# Patient Record
Sex: Male | Born: 1960
Health system: Southern US, Community
[De-identification: ages and names within clinical notes are randomized; demographics above are authoritative.]

## PROBLEM LIST (undated history)

## (undated) DIAGNOSIS — K8020 Calculus of gallbladder without cholecystitis without obstruction: Secondary | ICD-10-CM

## (undated) DIAGNOSIS — E7800 Pure hypercholesterolemia, unspecified: Secondary | ICD-10-CM

## (undated) DIAGNOSIS — H269 Unspecified cataract: Secondary | ICD-10-CM

## (undated) DIAGNOSIS — I10 Essential (primary) hypertension: Secondary | ICD-10-CM

## (undated) DIAGNOSIS — M1990 Unspecified osteoarthritis, unspecified site: Secondary | ICD-10-CM

## (undated) DIAGNOSIS — R42 Dizziness and giddiness: Secondary | ICD-10-CM

## (undated) DIAGNOSIS — R002 Palpitations: Secondary | ICD-10-CM

## (undated) HISTORY — DX: Unspecified cataract: H26.9

## (undated) HISTORY — DX: Pure hypercholesterolemia, unspecified: E78.00

## (undated) HISTORY — DX: Palpitations: R00.2

## (undated) HISTORY — DX: Dizziness and giddiness: R42

## (undated) HISTORY — DX: Calculus of gallbladder without cholecystitis without obstruction: K80.20

## (undated) HISTORY — DX: Unspecified osteoarthritis, unspecified site: M19.90

## (undated) HISTORY — DX: Essential (primary) hypertension: I10

---

## 1997-07-28 HISTORY — PX: KNEE SURGERY: SHX244

## 2003-11-21 ENCOUNTER — Encounter: Payer: Self-pay | Admitting: Internal Medicine

## 2003-11-22 ENCOUNTER — Encounter: Payer: Self-pay | Admitting: Internal Medicine

## 2007-06-11 ENCOUNTER — Ambulatory Visit: Payer: Self-pay | Admitting: Family Medicine

## 2007-06-11 ENCOUNTER — Encounter (INDEPENDENT_AMBULATORY_CARE_PROVIDER_SITE_OTHER): Payer: Self-pay | Admitting: Internal Medicine

## 2007-06-11 DIAGNOSIS — R079 Chest pain, unspecified: Secondary | ICD-10-CM | POA: Insufficient documentation

## 2007-06-11 DIAGNOSIS — E669 Obesity, unspecified: Secondary | ICD-10-CM | POA: Insufficient documentation

## 2007-06-11 DIAGNOSIS — K219 Gastro-esophageal reflux disease without esophagitis: Secondary | ICD-10-CM | POA: Insufficient documentation

## 2007-06-11 DIAGNOSIS — L80 Vitiligo: Secondary | ICD-10-CM | POA: Insufficient documentation

## 2007-06-14 ENCOUNTER — Ambulatory Visit: Payer: Self-pay | Admitting: Internal Medicine

## 2007-06-18 ENCOUNTER — Ambulatory Visit: Payer: Self-pay | Admitting: Family Medicine

## 2007-06-18 DIAGNOSIS — R7309 Other abnormal glucose: Secondary | ICD-10-CM | POA: Insufficient documentation

## 2007-06-18 DIAGNOSIS — E781 Pure hyperglyceridemia: Secondary | ICD-10-CM | POA: Insufficient documentation

## 2007-06-18 DIAGNOSIS — E782 Mixed hyperlipidemia: Secondary | ICD-10-CM | POA: Insufficient documentation

## 2007-06-23 ENCOUNTER — Ambulatory Visit: Payer: Self-pay | Admitting: Cardiology

## 2007-06-30 ENCOUNTER — Encounter (INDEPENDENT_AMBULATORY_CARE_PROVIDER_SITE_OTHER): Payer: Self-pay | Admitting: Internal Medicine

## 2007-06-30 ENCOUNTER — Encounter: Payer: Self-pay | Admitting: Cardiology

## 2007-06-30 ENCOUNTER — Ambulatory Visit: Payer: Self-pay

## 2007-09-02 ENCOUNTER — Ambulatory Visit: Payer: Self-pay | Admitting: Family Medicine

## 2007-09-08 LAB — CONVERTED CEMR LAB
Cholesterol: 161 mg/dL (ref 0–200)
HDL: 25 mg/dL — ABNORMAL LOW (ref >39.0)
Total CHOL/HDL Ratio: 6.4
VLDL: 53 mg/dL — ABNORMAL HIGH (ref 0–40)

## 2007-09-09 ENCOUNTER — Ambulatory Visit: Payer: Self-pay | Admitting: Family Medicine

## 2008-07-04 ENCOUNTER — Ambulatory Visit: Payer: Self-pay | Admitting: Family Medicine

## 2008-07-04 DIAGNOSIS — R42 Dizziness and giddiness: Secondary | ICD-10-CM | POA: Insufficient documentation

## 2008-07-05 ENCOUNTER — Ambulatory Visit: Payer: Self-pay | Admitting: Family Medicine

## 2008-07-07 LAB — CONVERTED CEMR LAB
CO2: 30 meq/L (ref 19–32)
Calcium: 9.3 mg/dL (ref 8.4–10.5)
Chloride: 109 meq/L (ref 96–112)
Direct LDL: 70.7 mg/dL
GFR calc non Af Amer: 85 mL/min
PSA: 0.79 ng/mL (ref 0.10–4.00)
Sodium: 141 meq/L (ref 135–145)
Total CHOL/HDL Ratio: 7.5
Triglycerides: 325 mg/dL (ref 0–149)
VLDL: 65 mg/dL — ABNORMAL HIGH (ref 0–40)

## 2008-07-11 ENCOUNTER — Encounter (INDEPENDENT_AMBULATORY_CARE_PROVIDER_SITE_OTHER): Payer: Self-pay | Admitting: Internal Medicine

## 2008-07-11 LAB — CONVERTED CEMR LAB
ALT: 38 units/L (ref 0–53)
Alkaline Phosphatase: 66 units/L (ref 39–117)
BUN: 14 mg/dL (ref 6–23)
Basophils Absolute: 0 10*3/uL (ref 0.0–0.1)
Basophils Relative: 0.4 % (ref 0.0–1.0)
Calcium: 9.6 mg/dL (ref 8.4–10.5)
Eosinophils Absolute: 0.1 10*3/uL (ref 0.0–0.6)
GFR calc Af Amer: 103 mL/min
GFR calc non Af Amer: 85 mL/min
HDL: 24.2 mg/dL — ABNORMAL LOW (ref >39.0)
LDL Cholesterol: 83 mg/dL (ref 0–99)
Lymphocytes Relative: 32.5 % (ref 12.0–46.0)
MCHC: 35.6 g/dL (ref 30.0–36.0)
MCV: 89.1 fL (ref 78.0–100.0)
Monocytes Relative: 6.7 % (ref 3.0–11.0)
Neutro Abs: 3.6 10*3/uL (ref 1.4–7.7)
Platelets: 192 10*3/uL (ref 150–400)
Potassium: 4.4 meq/L (ref 3.5–5.1)
TSH: 2.19 microintl units/mL (ref 0.35–5.50)
Total Bilirubin: 0.8 mg/dL (ref 0.3–1.2)
Total Protein: 6.9 g/dL (ref 6.0–8.3)
Triglycerides: 289 mg/dL (ref 0–149)
VLDL: 58 mg/dL — ABNORMAL HIGH (ref 0–40)

## 2008-08-11 ENCOUNTER — Ambulatory Visit: Payer: Self-pay | Admitting: Internal Medicine

## 2008-08-21 ENCOUNTER — Encounter (INDEPENDENT_AMBULATORY_CARE_PROVIDER_SITE_OTHER): Payer: Self-pay | Admitting: *Deleted

## 2008-08-22 LAB — CONVERTED CEMR LAB
ALT: 56 units/L — ABNORMAL HIGH (ref 0–53)
AST: 40 units/L — ABNORMAL HIGH (ref 0–37)
Alkaline Phosphatase: 48 units/L (ref 39–117)
HDL: 26.4 mg/dL — ABNORMAL LOW (ref >39.0)
Total Bilirubin: 1 mg/dL (ref 0.3–1.2)
Total CHOL/HDL Ratio: 7.2

## 2008-08-30 ENCOUNTER — Telehealth (INDEPENDENT_AMBULATORY_CARE_PROVIDER_SITE_OTHER): Payer: Self-pay | Admitting: Internal Medicine

## 2009-02-21 ENCOUNTER — Ambulatory Visit: Payer: Self-pay | Admitting: Family Medicine

## 2009-02-22 LAB — CONVERTED CEMR LAB
ALT: 43 units/L (ref 0–53)
AST: 38 units/L — ABNORMAL HIGH (ref 0–37)
Cholesterol: 154 mg/dL (ref 0–200)
Direct LDL: 76.1 mg/dL
Total CHOL/HDL Ratio: 6
VLDL: 62.2 mg/dL — ABNORMAL HIGH (ref 0.0–40.0)

## 2009-05-22 ENCOUNTER — Ambulatory Visit: Payer: Self-pay | Admitting: Family Medicine

## 2009-05-22 DIAGNOSIS — N410 Acute prostatitis: Secondary | ICD-10-CM | POA: Insufficient documentation

## 2009-05-22 LAB — CONVERTED CEMR LAB
Bacteria, UA: 0
Glucose, Urine, Semiquant: NEGATIVE
Nitrite: NEGATIVE
Specific Gravity, Urine: 1.015
WBC Urine, dipstick: NEGATIVE
pH: 6.5

## 2009-05-24 LAB — CONVERTED CEMR LAB
ALT: 40 units/L (ref 0–53)
AST: 26 units/L (ref 0–37)
BUN: 14 mg/dL (ref 6–23)
Calcium: 9 mg/dL (ref 8.4–10.5)
Chloride: 106 meq/L (ref 96–112)
Creatinine, Ser: 1.1 mg/dL (ref 0.4–1.5)
Direct LDL: 151.4 mg/dL
HDL: 31.2 mg/dL — ABNORMAL LOW (ref >39.00)
Triglycerides: 102 mg/dL (ref 0.0–149.0)
VLDL: 20.4 mg/dL (ref 0.0–40.0)

## 2009-08-09 ENCOUNTER — Ambulatory Visit: Payer: Self-pay | Admitting: Family Medicine

## 2009-08-09 ENCOUNTER — Encounter: Payer: Self-pay | Admitting: Family Medicine

## 2009-08-09 DIAGNOSIS — R310 Gross hematuria: Secondary | ICD-10-CM | POA: Insufficient documentation

## 2009-08-09 LAB — CONVERTED CEMR LAB
Glucose, Urine, Semiquant: NEGATIVE
Nitrite: NEGATIVE
Specific Gravity, Urine: 1.02
WBC Urine, dipstick: NEGATIVE

## 2009-08-22 ENCOUNTER — Telehealth: Payer: Self-pay | Admitting: Family Medicine

## 2009-08-23 ENCOUNTER — Encounter: Payer: Self-pay | Admitting: Family Medicine

## 2009-08-28 ENCOUNTER — Encounter (INDEPENDENT_AMBULATORY_CARE_PROVIDER_SITE_OTHER): Payer: Self-pay | Admitting: *Deleted

## 2009-09-13 ENCOUNTER — Ambulatory Visit: Payer: Self-pay | Admitting: Family Medicine

## 2009-09-13 LAB — CONVERTED CEMR LAB
Direct LDL: 81.4 mg/dL
Glucose, Bld: 101 mg/dL — ABNORMAL HIGH (ref 70–99)
HDL: 33.4 mg/dL — ABNORMAL LOW (ref >39.00)
VLDL: 52 mg/dL — ABNORMAL HIGH (ref 0.0–40.0)

## 2010-03-25 ENCOUNTER — Encounter: Payer: Self-pay | Admitting: Family Medicine

## 2010-08-27 NOTE — Letter (Signed)
Summary: [facility]  [facility]   Imported By: Lanelle Bal 09/03/2009 12:03:41  _____________________________________________________________________  External Attachment:    Type:   Image     Comment:   External Document

## 2010-08-27 NOTE — Letter (Signed)
Summary: [facility]  [facility]   Imported By: Lanelle Bal 04/03/2010 14:19:04  _____________________________________________________________________  External Attachment:    Type:   Image     Comment:   External Document  Appended Document: [facility] hematuria resolved, likely due to acute prostatitis.

## 2010-08-27 NOTE — Progress Notes (Signed)
Summary: Valium rx  Phone Note Call from Patient Call back at [phone]   Caller: Eulis Manly Call For: Dr. Dayton Martes Summary of Call: Pt saw Dr. Dayton Martes on 08/09/09 and got rx for Trilipix and pt and Mr. Maisie Fus thought Dr. Dayton Martes was going to give rx for Valium 5mg  # 2 pills on same rx but when [facility] Tr[facility]ipix was there. Pt saw urologist and has to go back to have a test Barbara Cower called where a camera would ck pt's prostate and pt needs the valium for the test. Pt would like me called to [facility] [phone] if possible. Please advise.  Initial call taken by: Lewanda Rife LPN,  August 22, 2009 3:39 PM  Follow-up for Phone Call        Medication phoned to pharmacy.  Follow-up by: Delilah Shan CMA Duncan Dull),  August 22, 2009 4:19 PM    New/Updated Medications: ALPRAZOLAM 0.5 MG  TABS (ALPRAZOLAM) 1 tab by mouth at bedtime evening before procedure, 1 tab by mouth 1 hour before procedure. Prescriptions: ALPRAZOLAM 0.5 MG  TABS (ALPRAZOLAM) 1 tab by mouth at bedtime evening before procedure, 1 tab by mouth 1 hour before procedure.  #2 x 0   Entered and Authorized by:   Ruthe Mannan MD   Signed by:   Delilah Shan CMA (AAMA) on 08/22/2009   Method used:   Historical   RxID:   [id number]

## 2010-08-27 NOTE — Assessment & Plan Note (Signed)
Summary: BLOOD IN URINE/CLE   Vital Signs:  Patient profile:   50 year old Farrell Height:      64 inches Weight:      190.25 pounds BMI:     32.77 Temp:     98 degrees F oral Pulse rate:   88 / minute Pulse rhythm:   regular BP sitting:   102 / 62  (left arm) Cuff size:   large  Vitals Entered By: Delilah Shan CMA Duncan Dull) (August 09, 2009 9:01 AM) CC: Blood in urine   History of Present Illness: Here for gross hematuria for past couple of days, feels no discomfort when he urinates. Has been ongoing for several months. Seen in october and treated for prostatitis. These symptoms are different, although still has-dribbling following completion of urination discomfort in lower abd.  Low back pain resolved.  Microscopic hematuria in October.  Of note, had URI three weeks ago.  No fevers, chills, nausea, or vomitng. He is non smoker. Works as a Production designer, theatre/television/film for [employer], not physically strenuous.   Current Medications (verified): 1)  Trilipix 135 Mg Cpdr (Choline Fenofibrate) .Marland Kitchen.. 1 Once Daily For Triglycerides By Mouth  Allergies (verified): No Known Drug Allergies  Review of Systems      See HPI General:  Denies chills, fatigue, fever, and malaise. GI:  Complains of abdominal pain; denies bloody stools, change in bowel habits, nausea, and vomiting. GU:  Complains of hematuria; denies discharge and incontinence.  Physical Exam  General:  alert, well-developed, well-nourished, and well-hydrated.   Abdomen:  no  CVA or suprapubic tenderness Psych:  normally interactive and good eye contact.     Impression & Recommendations:  Problem # 1:  GROSS HEMATURIA (ICD-599.71) Assessment Unchanged Wide differential, glomerular vs extraglomerular.  Given duration of symptoms, along with prostate complaints (dribbling),  will refer to urology for further work up rather than drawing labs here.  Likely needs cystoscopy. The following medications were removed from the medication  list:    Ciprofloxacin Hcl 500 Mg Tabs (Ciprofloxacin hcl) .Marland Kitchen... 1 two times a day  Orders: UA Dipstick w/o Micro (manual) ([address]) Urology Referral (Urology)  Complete Medication List: 1)  Trilipix 135 Mg Cpdr (Choline fenofibrate) .Marland Kitchen.. 1 once daily for triglycerides by mouth  Patient Instructions: 1)  Nice to meet you. 2)  Please stop by to see Mairon on your way out to set up your urology referral. Prescriptions: TRILIPIX 135 MG CPDR (CHOLINE FENOFIBRATE) 1 once daily for triglycerides by mouth  #30 x 6   Entered and Authorized by:   Ruthe Mannan MD   Signed by:   Ruthe Mannan MD on 08/09/2009   Method used:   [id number]       [address]       [place], [place]  [address]       Ph: [phone]       Fax: [phone]   RxID:   [id number]   Laboratory Results   Urine Tests  Date/Time Received: August 09, 2009 9:08 AM  Date/Time Reported: August 09, 2009 9:08 AM   Routine Urinalysis   Color: yellow Appearance: Hazy Glucose: negative   (Normal Range: Negative) Bilirubin: negative   (Normal Range: Negative) Ketone: negative   (Normal Range: Negative) Spec. Gravity: 1.020   (Normal Range: 1.003-1.035) Blood: moderate   (Normal Range: Negative) pH: 6.5   (Normal Range: 5.0-8.0) Protein: trace   (Normal Range:  Negative) Urobilinogen: 0.2   (Normal Range: 0-1) Nitrite: negative   (Normal Range: Negative) Leukocyte Esterace: negative   (Normal Range: Negative)       Current Allergies (reviewed today): No known allergies

## 2010-08-27 NOTE — Consult Note (Signed)
Summary: [facility]  [facility]   Imported By: Lanelle Bal 08/16/2009 09:14:47  _____________________________________________________________________  External Attachment:    Type:   Image     Comment:   External Document

## 2010-08-27 NOTE — Letter (Signed)
Summary: [place] No Show Letter  [place] [facility]  [address] [place], [place] [address]   Phone: [phone]  Fax: [phone]    08/28/2009 MRN: [id number]  [facility] [address] [place], [place]  [address]   Dear Mr. STIDD,   Our records indicate that you missed your scheduled appointment with _____lab________________ on 1.31.11____________.  Please contact this office to reschedule your appointment as soon as possible.  It is important that you keep your scheduled appointments with your physician, so we can provide you the best care possible.  Please be advised that there may be a charge for "no show" appointments.    Sincerely,   [place] at [facility]

## 2010-11-27 ENCOUNTER — Ambulatory Visit (INDEPENDENT_AMBULATORY_CARE_PROVIDER_SITE_OTHER): Payer: BC Managed Care – PPO | Admitting: Family Medicine

## 2010-11-27 ENCOUNTER — Other Ambulatory Visit: Payer: Self-pay | Admitting: *Deleted

## 2010-11-27 ENCOUNTER — Encounter: Payer: Self-pay | Admitting: Family Medicine

## 2010-11-27 VITALS — BP 110/70 | HR 76 | Temp 98.3°F | Wt 194.0 lb

## 2010-11-27 DIAGNOSIS — E782 Mixed hyperlipidemia: Secondary | ICD-10-CM

## 2010-11-27 DIAGNOSIS — E781 Pure hyperglyceridemia: Secondary | ICD-10-CM

## 2010-11-27 LAB — HEPATIC FUNCTION PANEL
ALT: 32 U/L (ref 0–53)
AST: 25 U/L (ref 0–37)
Bilirubin, Direct: 0.1 mg/dL (ref 0.0–0.3)
Total Bilirubin: 0.9 mg/dL (ref 0.3–1.2)

## 2010-11-27 LAB — LIPID PANEL
Cholesterol: 185 mg/dL (ref 0–200)
HDL: 29.4 mg/dL — ABNORMAL LOW (ref >39.00)
Triglycerides: 153 mg/dL — ABNORMAL HIGH (ref 0.0–149.0)

## 2010-11-27 MED ORDER — CHOLINE FENOFIBRATE 135 MG PO CPDR: 135.0000 mg | DELAYED_RELEASE_CAPSULE | Freq: Every day | ORAL | Status: DC

## 2010-11-27 NOTE — Assessment & Plan Note (Signed)
Overdue for labs. Recheck FLP, liver function today. Refill Trililpix once lab results returned.

## 2010-11-27 NOTE — Progress Notes (Signed)
50 yo here for follow up hypertriglyceridemia.  Has been on Trilipix for two years. Went to get rx from pharmacy and was told he needed to follow up here first. He is fasting today. Has been out of his Trilipix for several months.  Lab Results  Component Value Date   HDL 33.40* 09/13/2009   HDL 31.20* 05/22/2009   HDL 26.00* 02/21/2009    Lab Results  Component Value Date   TRIG 260.0* 09/13/2009   TRIG 102.0 05/22/2009   TRIG 311.0* 02/21/2009     Lab Results  Component Value Date   ALT 40 05/22/2009   AST 26 05/22/2009   ALKPHOS 48 08/11/2008   BILITOT 1.0 08/11/2008    Otherwise doing well. No complaints.  The PMH, PSH, Social History, Family History, Medications, and allergies have been reviewed in [facility], and have been updated if relevant.  ROS:  Patient reports no  vision/ hearing changes,anorexia, weight change, fever ,adenopathy, persistant / recurrent hoarseness, swallowing issues, chest pain,palpitations, edema,persistant / recurrent cough, hemoptysis, dyspnea(rest, exertional, paroxysmal nocturnal), gastrointestinal  bleeding (melena, rectal bleeding), abdominal pain, excessive heart burn, GU symptoms( dysuria, hematuria, pyuria, voiding/incontinence  Issues) syncope, focal weakness, memory loss,numbness & tingling, skin/hair/nail changes,depression, anxiety, abnormal bruising/bleeding, musculoskeletal symptoms/signs.  Physical exam: BP 110/70  Pulse 76  Temp(Src) 98.3 F (36.8 C) (Oral)  Wt 194 lb (87.998 kg) Gen:  Alert, pleasant, NAD. Here with interpreter. CVS:  RRR, no MRG Resp:  CTA bilaterally. Ext:  No edema Psych:  Very pleasant, good eye contact

## 2010-12-10 NOTE — Assessment & Plan Note (Signed)
[facility] OFFICE NOTE   AKSHAR, STARNES                          MRN:          [id number]  DATE:06/23/2007                            DOB:          Feb 21, 1961    REASON FOR CONSULTATION:  I was asked by Atha Starks. Bean, F.N.P. and Dr.  Karie Schwalbe to consult on Mr. Brandon Farrell, with two episodes of  shortness of breath, feeling sweaty and some chest discomfort.   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  He comes in today with a good friend who is interpreting as  well.  He has had two episodes of chest tightness, breaking out in a  little bit of a sweat and feeling exhausted while doing work around  the house.  Once he was sawing a piece of wood.  The other time he was  working around a window.  He saw Billie Bean.   LABORATORY DATA:  His laboratory data is unremarkable, including a  hemoglobin of 16.3, normal white count and normal chemistry profile.  His total cholesterol was 165, HDL 24.2, triglycerides 289, LDL 83.  His  VLDL was 58, suggesting a high carb diet.  His liver function tests were  normal.  His TSH was normal.   His electrocardiogram was normal in their office.  He is very concerned  and would very much like to get back into an exercise program.   SOCIAL HISTORY:  He does not smoke.  He drinks one alcoholic beverage  per week.  He does not use any recreational products.  He is married and  has three children.  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   PAST MEDICAL/SURGICAL HISTORY:  He has had a left knee ACL repair in  2000.   FAMILY HISTORY:  Negative for premature coronary artery disease.   REVIEW OF SYSTEMS:  Other than some borderline diabetes, is negative  except for the HPI.   PHYSICAL EXAMINATION:  VITAL SIGNS:  Blood pressure 100/62, pulse 62 and  regular.  His electrocardiogram is normal.  He is 5 feet 4 inches,  weight  200 pounds.  GENERAL:  He is muscular and athletic-appearing.  HEENT:  Normocephalic and atraumatic.  Pupils equal, round, reactive to  light and accommodation.  Extraocular movements intact.  Sclerae clear.  Facial symmetry is normal.  NECK:  Carotid upstrokes are equal bilaterally without bruits.  No  jugular venous distention.  Thyroid is not enlarged.  Trachea is  midline.  LUNGS:  Clear without rub or adventitial sounds.  HEART:  Reveals a poorly-appreciated PMI.  He is very muscular.  Normal  S1 and S2 without murmur, rub or gallop.  ABDOMEN:  Soft, good bowel sounds.  No midline bruit.  EXTREMITIES:  No clubbing, cyanosis or edema.  Pulses intact.  No sign  of deep venous thrombosis.  NEUROLOGIC:  Exam is intact.  SKIN:  Unremarkable.   ASSESSMENT:  Two episodes of exhaustion, chest tightness and sweatiness.  These were both with exertional  activity.  Rule out any obstructive  coronary artery disease.  His risk factors are pre-diabetes, low high-  density lipoprotein and mixed hyperlipidemia.   PLAN:  1. Rest exercise stress Myoview.  If this is negative for ischemia, we      will clear him for exercise.     Thomas C. Daleen Squibb, MD, [facility]  Electronically Signed    TCW/MedQ  DD: 06/23/2007  DT: 06/23/2007  Job #: [id number]   cc:   Billie D. Bean, FNP

## 2010-12-25 ENCOUNTER — Encounter: Payer: Self-pay | Admitting: Family Medicine

## 2010-12-26 ENCOUNTER — Ambulatory Visit (INDEPENDENT_AMBULATORY_CARE_PROVIDER_SITE_OTHER): Payer: BC Managed Care – PPO | Admitting: Family Medicine

## 2010-12-26 ENCOUNTER — Encounter: Payer: Self-pay | Admitting: Family Medicine

## 2010-12-26 VITALS — BP 110/70 | HR 61 | Temp 97.6°F | Ht 64.0 in | Wt 194.8 lb

## 2010-12-26 DIAGNOSIS — R109 Unspecified abdominal pain: Secondary | ICD-10-CM

## 2010-12-26 DIAGNOSIS — N410 Acute prostatitis: Secondary | ICD-10-CM

## 2010-12-26 LAB — POCT URINALYSIS DIPSTICK
Ketones, UA: NEGATIVE
Spec Grav, UA: 1.03
pH, UA: 5

## 2010-12-26 MED ORDER — CIPROFLOXACIN HCL 500 MG PO TABS: 500.0000 mg | ORAL_TABLET | Freq: Two times a day (BID) | ORAL | Status: DC

## 2010-12-26 NOTE — Assessment & Plan Note (Signed)
New. Cipro 500 mg twice daily for 4 weeks. Send urine for culture.  UA pos for blood.

## 2010-12-26 NOTE — Progress Notes (Signed)
50 yo with acute onset of abdominal pain. Abdominal Pain: suprapubic Onset/Timing: 3 days ago, occurs only after urinating.  Has perineal pain as well.  Subjective fever over the weekend with cloudy urine.  +PMH of prostatitis  ROS Loss of appetite:no Vomiting: no Diarrhea:no Rectal bleeding:no Fever:yes Weight loss:no  Meds, Vitals, and allergies reviewed  Physical exam: BP 110/70  Pulse 61  Temp(Src) 97.6 F (36.4 C) (Oral)  Ht 5\' 4"  (1.626 m)  Wt 194 lb 12.8 oz (88.361 kg)  BMI 33.44 kg/m2  GEN: nad, alert and oriented HEENT: mucous membranes moist NECK: supple w/o LA CV: rrr.  PULM: ctab, no inc wob ABD: soft, +bs EXT: no edema Prostate- tender

## 2011-03-27 ENCOUNTER — Encounter: Payer: Self-pay | Admitting: Family Medicine

## 2011-03-27 ENCOUNTER — Ambulatory Visit (INDEPENDENT_AMBULATORY_CARE_PROVIDER_SITE_OTHER): Payer: BC Managed Care – PPO | Admitting: Family Medicine

## 2011-03-27 VITALS — BP 110/70 | HR 71 | Temp 98.5°F | Wt 195.0 lb

## 2011-03-27 DIAGNOSIS — N63 Unspecified lump in unspecified breast: Secondary | ICD-10-CM

## 2011-03-27 DIAGNOSIS — N6310 Unspecified lump in the right breast, unspecified quadrant: Secondary | ICD-10-CM | POA: Insufficient documentation

## 2011-03-27 DIAGNOSIS — R7309 Other abnormal glucose: Secondary | ICD-10-CM

## 2011-03-27 DIAGNOSIS — E782 Mixed hyperlipidemia: Secondary | ICD-10-CM

## 2011-03-27 LAB — BASIC METABOLIC PANEL
BUN: 13 mg/dL (ref 6–23)
GFR: 72.54 mL/min (ref >60.00)
Potassium: 4.2 mEq/L (ref 3.5–5.1)
Sodium: 139 mEq/L (ref 135–145)

## 2011-03-27 LAB — HEPATIC FUNCTION PANEL
AST: 26 U/L (ref 0–37)
Bilirubin, Direct: 0 mg/dL (ref 0.0–0.3)
Total Bilirubin: 0.5 mg/dL (ref 0.3–1.2)

## 2011-03-27 LAB — LIPID PANEL
HDL: 31.8 mg/dL — ABNORMAL LOW (ref >39.00)
Total CHOL/HDL Ratio: 6
VLDL: 50.6 mg/dL — ABNORMAL HIGH (ref 0.0–40.0)

## 2011-03-27 LAB — LDL CHOLESTEROL, DIRECT: Direct LDL: 124.7 mg/dL

## 2011-03-27 NOTE — Progress Notes (Signed)
  Subjective:    Patient ID: Brandon Farrell, male    DOB: October 23, 1960, 50 y.o.   MRN: [id number]  HPI  50 yo here for right breast mass, hurts to touch and to twist body to right. Feels pain in his nipple as well. No discharge from nipple. No erythema, no fever or chills.  Never had anything like this before.  Patient Active Problem List  Diagnoses  . HYPERTRIGLYCERIDEMIA  . HYPERLIPIDEMIA  . OBESITY  . GERD  . GROSS HEMATURIA  . PROSTATITIS, ACUTE  . VITILIGO  . DIZZINESS  . CHEST PAIN  . HYPERGLYCEMIA  . Breast mass, right   No past medical history on file. Past Surgical History  Procedure Date  . Knee orthroscopic 1999    left  . Knee surgery 1999   History  Substance Use Topics  . Smoking status: Never Smoker   . Smokeless tobacco: Not on file  . Alcohol Use: Not on file   No family history on file. No Known Allergies Current Outpatient Prescriptions on File Prior to Visit  Medication Sig Dispense Refill  . Choline Fenofibrate (TRILIPIX) 135 MG capsule Take 1 capsule (135 mg total) by mouth daily.  30 capsule  6  . ALPRAZolam (XANAX) 0.5 MG tablet Take 0.5 mg by mouth at bedtime as needed.         The PMH, PSH, Social History, Family History, Medications, and allergies have been reviewed in [facility], and have been updated if relevant.  Review of Systems    See HPI Objective:   Physical Exam  BP 110/70  Pulse 71  Temp(Src) 98.5 F (36.9 C) (Oral)  Wt 195 lb (88.451 kg) Gen:  Alert, pleasant, NAD Right breast:  Palpable small density above right nipple, TTP, also notice a muscle defect when he twists to right. Psych:  Alert, oriented x 3    Assessment & Plan:   1. Breast mass, right  MM Digital Diagnostic Bilat  New.  Etiology unclear- will order immediate mammogram for further evaluation. Discussed this with pt and interpreter- can use NSAIDs to help with pain. The patient indicates understanding of these issues and agrees with the plan.

## 2011-03-27 NOTE — Patient Instructions (Signed)
Good to see you. Please stop by to see Brandon Farrell on your way out. 

## 2011-04-01 ENCOUNTER — Ambulatory Visit: Payer: Self-pay | Admitting: Family Medicine

## 2011-04-02 ENCOUNTER — Encounter: Payer: Self-pay | Admitting: Family Medicine

## 2011-08-04 ENCOUNTER — Emergency Department: Payer: Self-pay | Admitting: Emergency Medicine

## 2011-08-04 LAB — CBC WITH DIFFERENTIAL/PLATELET
Basophil %: 0.5 %
Eosinophil %: 2.8 %
Lymphocyte %: 40.9 %
MCHC: 34.1 g/dL (ref 32.0–36.0)
Neutrophil #: 3.5 10*3/uL (ref 1.4–6.5)
Neutrophil %: 47.1 %
RBC: 4.91 10*6/uL (ref 4.40–5.90)
WBC: 7.4 10*3/uL (ref 3.8–10.6)

## 2011-08-04 LAB — COMPREHENSIVE METABOLIC PANEL
Albumin: 4.4 g/dL (ref 3.4–5.0)
Anion Gap: 10 (ref 7–16)
BUN: 19 mg/dL — ABNORMAL HIGH (ref 7–18)
Calcium, Total: 9.3 mg/dL (ref 8.5–10.1)
EGFR (Non-African Amer.): >60
Glucose: 116 mg/dL — ABNORMAL HIGH (ref 65–99)
Osmolality: 286 (ref 275–301)
Potassium: 4.1 mmol/L (ref 3.5–5.1)
Sodium: 142 mmol/L (ref 136–145)

## 2011-08-04 LAB — LIPASE, BLOOD: Lipase: 157 U/L (ref 73–393)

## 2011-09-01 ENCOUNTER — Other Ambulatory Visit: Payer: Self-pay | Admitting: *Deleted

## 2011-09-01 MED ORDER — CHOLINE FENOFIBRATE 135 MG PO CPDR: 135.0000 mg | DELAYED_RELEASE_CAPSULE | Freq: Every day | ORAL | Status: DC

## 2011-10-22 ENCOUNTER — Encounter: Payer: Self-pay | Admitting: Family Medicine

## 2011-10-27 HISTORY — PX: LAPAROSCOPIC CHOLECYSTECTOMY: SUR755

## 2011-11-24 ENCOUNTER — Emergency Department: Payer: Self-pay | Admitting: *Deleted

## 2011-11-24 LAB — COMPREHENSIVE METABOLIC PANEL
Calcium, Total: 9 mg/dL (ref 8.5–10.1)
Chloride: 110 mmol/L — ABNORMAL HIGH (ref 98–107)
EGFR (African American): >60
Glucose: 103 mg/dL — ABNORMAL HIGH (ref 65–99)
Potassium: 3.7 mmol/L (ref 3.5–5.1)
SGOT(AST): 30 U/L (ref 15–37)
SGPT (ALT): 43 U/L
Sodium: 143 mmol/L (ref 136–145)

## 2011-11-24 LAB — URINALYSIS, COMPLETE
Bilirubin,UR: NEGATIVE
Blood: NEGATIVE
Ketone: NEGATIVE
Squamous Epithelial: NONE SEEN

## 2011-11-24 LAB — CBC
MCH: 30.7 pg (ref 26.0–34.0)
MCV: 90 fL (ref 80–100)
Platelet: 195 10*3/uL (ref 150–440)

## 2011-11-28 ENCOUNTER — Emergency Department: Payer: Self-pay | Admitting: *Deleted

## 2011-11-28 LAB — CBC WITH DIFFERENTIAL/PLATELET
Basophil %: 0.3 %
Eosinophil #: 0.2 10*3/uL (ref 0.0–0.7)
Eosinophil %: 2.2 %
HCT: 45.4 % (ref 40.0–52.0)
HGB: 15.4 g/dL (ref 13.0–18.0)
Lymphocyte #: 2.8 10*3/uL (ref 1.0–3.6)
MCH: 30.7 pg (ref 26.0–34.0)
MCHC: 34 g/dL (ref 32.0–36.0)
MCV: 90 fL (ref 80–100)
Neutrophil #: 4.3 10*3/uL (ref 1.4–6.5)
Platelet: 197 10*3/uL (ref 150–440)
RBC: 5.03 10*6/uL (ref 4.40–5.90)
RDW: 13.2 % (ref 11.5–14.5)

## 2011-11-28 LAB — COMPREHENSIVE METABOLIC PANEL
Albumin: 4.4 g/dL (ref 3.4–5.0)
Anion Gap: 7 (ref 7–16)
BUN: 17 mg/dL (ref 7–18)
Calcium, Total: 9.1 mg/dL (ref 8.5–10.1)
Creatinine: 1.22 mg/dL (ref 0.60–1.30)
Glucose: 116 mg/dL — ABNORMAL HIGH (ref 65–99)
Osmolality: 284 (ref 275–301)
Potassium: 3.4 mmol/L — ABNORMAL LOW (ref 3.5–5.1)
SGPT (ALT): 40 U/L
Sodium: 141 mmol/L (ref 136–145)

## 2011-12-03 ENCOUNTER — Ambulatory Visit: Payer: Self-pay | Admitting: Surgery

## 2011-12-04 LAB — PATHOLOGY REPORT

## 2012-02-09 ENCOUNTER — Telehealth: Payer: Self-pay | Admitting: *Deleted

## 2012-02-09 NOTE — Telephone Encounter (Signed)
Ok to give fenofibrate.

## 2012-02-09 NOTE — Telephone Encounter (Signed)
Received faxed note from pharmacy stating that Trilipix is on national back order. Pharmacy shows that they have Fenofibrate 134 mg. Please advise.

## 2012-02-09 NOTE — Telephone Encounter (Signed)
Advised pharmacist.  Pt has 2 refills left of trilipix, he will fill with fenofibrated instead.

## 2012-02-18 ENCOUNTER — Telehealth: Payer: Self-pay

## 2012-02-18 NOTE — Telephone Encounter (Signed)
Yes ok to take.  Agree with below.

## 2012-02-18 NOTE — Telephone Encounter (Signed)
Brandon Farrell called for pt; pt has been getting trilipix 135 mg and last picked up refill fenofibrate 134 mg. Pt wants to be sure OK to take. Explained fenofibrate is generic but pt wants answer from doctor.[facility].

## 2012-02-18 NOTE — Telephone Encounter (Signed)
Left message on voice mail advising Barbara Cower.

## 2012-03-11 ENCOUNTER — Other Ambulatory Visit: Payer: Self-pay | Admitting: Family Medicine

## 2012-03-11 DIAGNOSIS — Z125 Encounter for screening for malignant neoplasm of prostate: Secondary | ICD-10-CM

## 2012-03-11 DIAGNOSIS — Z0000 Encounter for general adult medical examination without abnormal findings: Secondary | ICD-10-CM

## 2012-03-11 DIAGNOSIS — E782 Mixed hyperlipidemia: Secondary | ICD-10-CM

## 2012-03-16 ENCOUNTER — Other Ambulatory Visit (INDEPENDENT_AMBULATORY_CARE_PROVIDER_SITE_OTHER): Payer: BC Managed Care – PPO

## 2012-03-16 DIAGNOSIS — Z125 Encounter for screening for malignant neoplasm of prostate: Secondary | ICD-10-CM

## 2012-03-16 DIAGNOSIS — E782 Mixed hyperlipidemia: Secondary | ICD-10-CM

## 2012-03-16 DIAGNOSIS — E7800 Pure hypercholesterolemia, unspecified: Secondary | ICD-10-CM

## 2012-03-16 DIAGNOSIS — Z0000 Encounter for general adult medical examination without abnormal findings: Secondary | ICD-10-CM

## 2012-03-16 LAB — COMPREHENSIVE METABOLIC PANEL
AST: 25 U/L (ref 0–37)
Albumin: 4.3 g/dL (ref 3.5–5.2)
Alkaline Phosphatase: 45 U/L (ref 39–117)
Glucose, Bld: 96 mg/dL (ref 70–99)
Potassium: 4.5 mEq/L (ref 3.5–5.1)
Sodium: 138 mEq/L (ref 135–145)
Total Bilirubin: 0.7 mg/dL (ref 0.3–1.2)
Total Protein: 6.8 g/dL (ref 6.0–8.3)

## 2012-03-16 LAB — LIPID PANEL
Cholesterol: 177 mg/dL (ref 0–200)
LDL Cholesterol: 120 mg/dL — ABNORMAL HIGH (ref 0–99)
Total CHOL/HDL Ratio: 5
VLDL: 24.2 mg/dL (ref 0.0–40.0)

## 2012-03-23 ENCOUNTER — Encounter: Payer: Self-pay | Admitting: Gastroenterology

## 2012-03-23 ENCOUNTER — Encounter: Payer: Self-pay | Admitting: Family Medicine

## 2012-03-23 ENCOUNTER — Ambulatory Visit (INDEPENDENT_AMBULATORY_CARE_PROVIDER_SITE_OTHER): Payer: BC Managed Care – PPO | Admitting: Family Medicine

## 2012-03-23 VITALS — BP 110/70 | HR 60 | Temp 98.0°F | Ht 64.25 in | Wt 189.0 lb

## 2012-03-23 DIAGNOSIS — Z0000 Encounter for general adult medical examination without abnormal findings: Secondary | ICD-10-CM

## 2012-03-23 DIAGNOSIS — E782 Mixed hyperlipidemia: Secondary | ICD-10-CM

## 2012-03-23 DIAGNOSIS — Z1211 Encounter for screening for malignant neoplasm of colon: Secondary | ICD-10-CM

## 2012-03-23 DIAGNOSIS — Z23 Encounter for immunization: Secondary | ICD-10-CM

## 2012-03-23 DIAGNOSIS — Z8249 Family history of ischemic heart disease and other diseases of the circulatory system: Secondary | ICD-10-CM

## 2012-03-23 NOTE — Patient Instructions (Addendum)
It was wonderful to see you. Your labs look great. I am so sorry for the loss of your son. Please know that we are here if you need [place].  Please stop by to see Shirlee Limerick on your way out to set up your referrals.

## 2012-03-23 NOTE — Progress Notes (Signed)
51 yo here for CPX.  Unfortunately, his 42 year old son passed away in his sleep 3 months ago. Autopsy showed it was an MI (CAD).  Brandon Farrell has no other known family h/o heart disease. He has had intermittent dizziness and SOB with exertion (had a stress test a couple of years ago that was neg). He has not had any of these symptoms recently.  Feels he is coping with his son's death as well as can be expected.  His birthday would have been [birth year] which was obviously a difficult day.  Hypertriglyceridemia- Has been on Trilipix for years, working well.  TG are actually better than they have been in years and he has lost a few pounds. Remaining very active- plays soccer. Lab Results  Component Value Date   CHOL 177 03/16/2012   HDL 32.50* 03/16/2012   LDLCALC 120* 03/16/2012   LDLDIRECT 124.7 03/27/2011   TRIG 121.0 03/16/2012   CHOLHDL 5 03/16/2012   Lab Results  Component Value Date   PSA 0.84 03/16/2012   PSA 0.79 07/05/2008   Patient Active Problem List  Diagnosis  . HYPERTRIGLYCERIDEMIA  . HYPERLIPIDEMIA  . OBESITY  . GERD  . GROSS HEMATURIA  . PROSTATITIS, ACUTE  . VITILIGO  . DIZZINESS  . CHEST PAIN  . HYPERGLYCEMIA  . Breast mass, right  . Routine general medical examination at a health care facility   No past medical history on file. Past Surgical History  Procedure Date  . Knee orthroscopic 1999    left  . Knee surgery 1999   History  Substance Use Topics  . Smoking status: Never Smoker   . Smokeless tobacco: Not on file  . Alcohol Use: Not on file   No family history on file. No Known Allergies Current Outpatient Prescriptions on File Prior to Visit  Medication Sig Dispense Refill  . ALPRAZolam (XANAX) 0.5 MG tablet Take 0.5 mg by mouth at bedtime as needed.        . Choline Fenofibrate (TRILIPIX) 135 MG capsule Take 1 capsule (135 mg total) by mouth daily.  30 capsule  6  . fenofibrate micronized (LOFIBRA) 134 MG capsule Take 134 mg by mouth daily before  breakfast.         The PMH, PSH, Social History, Family History, Medications, and allergies have been reviewed in [facility], and have been updated if relevant.  ROS:  Patient reports no  vision/ hearing changes,anorexia, weight change, fever ,adenopathy, persistant / recurrent hoarseness, swallowing issues, chest pain,palpitations, edema,persistant / recurrent cough, hemoptysis, dyspnea(rest, exertional, paroxysmal nocturnal), gastrointestinal  bleeding (melena, rectal bleeding), abdominal pain, excessive heart burn, GU symptoms( dysuria, hematuria, pyuria, voiding/incontinence  Issues) syncope, focal weakness, memory loss,numbness & tingling, skin/hair/nail changes,depression, anxiety, abnormal bruising/bleeding, musculoskeletal symptoms/signs.  Physical exam: BP 110/70  Pulse 60  Temp 98 F (36.7 C)  Ht 5' 4.25" (1.632 m)  Wt 189 lb (85.73 kg)  BMI 32.19 kg/m2 Wt Readings from Last 3 Encounters:  03/23/12 189 lb (85.73 kg)  03/27/11 195 lb (88.451 kg)  12/26/10 194 lb 12.8 oz (88.361 kg)    General:  overweght male in NAD Eyes:  PERRL Ears:  External ear exam shows no significant lesions or deformities.  Otoscopic examination reveals clear canals, tympanic membranes are intact bilaterally without bulging, retraction, inflammation or discharge. Hearing is grossly normal bilaterally. Nose:  External nasal examination shows no deformity or inflammation. Nasal mucosa are pink and moist without lesions or exudates. Mouth:  Oral mucosa and oropharynx  without lesions or exudates.  Teeth in good repair. Neck:  no carotid bruit or thyromegaly no cervical or supraclavicular lymphadenopathy  Lungs:  Normal respiratory effort, chest expands symmetrically. Lungs are clear to auscultation, no crackles or wheezes. Heart:  Normal rate and regular rhythm. S1 and S2 normal without gallop, murmur, click, rub or other extra sounds. Abdomen:  Bowel sounds positive,abdomen soft and non-tender without masses,  organomegaly or hernias noted. Pulses:  R and L posterior tibial pulses are full and equal bilaterally  Extremities:  no edema   Assessment and Plan: 1. HYPERLIPIDEMIA  TG much improved. Continue Trilipix.    2. Routine general medical examination at a health care facility  Reviewed preventive care protocols, scheduled due services, and updated immunizations Discussed nutrition, exercise, diet, and healthy lifestyle.   tdap Referral for colonoscopy  3. Family history of MI (myocardial infarction) Given how young his son was and with his own intermittent dizziness with exertion, will refer to cards for further work up. The patient indicates understanding of these issues and agrees with the plan.   Ambulatory referral to Cardiology  4. Screening for colon cancer  Ambulatory referral to Gastroenterology

## 2012-03-24 ENCOUNTER — Encounter: Payer: Self-pay | Admitting: Cardiovascular Disease

## 2012-03-25 ENCOUNTER — Ambulatory Visit (INDEPENDENT_AMBULATORY_CARE_PROVIDER_SITE_OTHER): Payer: BC Managed Care – PPO | Admitting: Cardiovascular Disease

## 2012-03-25 ENCOUNTER — Encounter: Payer: Self-pay | Admitting: Cardiovascular Disease

## 2012-03-25 VITALS — BP 122/76 | HR 60 | Ht 62.0 in | Wt 192.0 lb

## 2012-03-25 DIAGNOSIS — R079 Chest pain, unspecified: Secondary | ICD-10-CM

## 2012-03-25 DIAGNOSIS — E781 Pure hyperglyceridemia: Secondary | ICD-10-CM

## 2012-03-25 DIAGNOSIS — Z8249 Family history of ischemic heart disease and other diseases of the circulatory system: Secondary | ICD-10-CM

## 2012-03-25 NOTE — Progress Notes (Signed)
    Brandon Farrell Date of Birth  [birth year]       [facility]    [place] [address]  [address], [address] [place], [place]  [address]   [place], [place]  [address] [phone]     [phone]   Fax  [phone]    Fax [phone]  Problem List: 1. Hypertriglyceridemia  History of Present Illness:  Brandon Farrell is a 51 yo hispanic gentleman with a hx of hyperlipidemia.  He was accompanied by a friend who serves as Equities trader. His son died suddently in his sleep at age 30.  He's here today for evaluation and risk reduction. He's not had any cardiac complaints.   Brandon Farrell remains quite active. He plays soccer on regular basis. He's never had episodes of chest pain or shortness breath.  He owns [employer].  He has a history of hyperlipidemia and his lipids have been fairly well controlled.  Current Outpatient Prescriptions on File Prior to Visit  Medication Sig Dispense Refill  . Choline Fenofibrate (TRILIPIX) 135 MG capsule Take 1 capsule (135 mg total) by mouth daily.  30 capsule  6    No Known Allergies  Past Medical History  Diagnosis Date  . Hypertension   . Hypercholesterolemia   . Cholelithiasis     Past Surgical History  Procedure Date  . Knee orthroscopic 1999    left  . Knee surgery 1999    History  Smoking status  . Never Smoker   Smokeless tobacco  . Not on file    History  Alcohol Use  . Yes    social drinker.    Family History  Problem Relation Age of Onset  . Heart disease Son     Reviw of Systems:  Reviewed in the HPI.  All other systems are negative.  Physical Exam: Blood pressure 122/76, pulse 60, height 5\' 2"  (1.575 m), weight 192 lb (87.091 kg). General: Well developed, well nourished, in no acute distress.  Head: Normocephalic, atraumatic, sclera non-icteric, mucus membranes are moist,   Neck: Supple. Carotids are 2 + without bruits. No JVD  Lungs: Clear bilaterally to  auscultation.  Heart: regular rate.  normal  S1 S2. Has a very soft systolic outflow murmur.  Abdomen: Soft, non-tender, non-distended with normal bowel sounds. No hepatomegaly. No rebound/guarding. No masses.  Msk:  Strength and tone are normal  Extremities: No clubbing or cyanosis. No edema.  Distal pedal pulses are 2+ and equal bilaterally.  Neuro: Alert and oriented X 3. Moves all extremities spontaneously.  Psych:  Responds to questions appropriately with a normal affect.  ECG: 03/25/2009-normal sinus rhythm at 60 beats a minute. EKG is normal.  Assessment / Plan:

## 2012-03-25 NOTE — Patient Instructions (Addendum)
Your physician has requested that you have an echocardiogram. Echocardiography is a painless test that uses sound waves to create images of your heart. It provides your doctor with information about the size and shape of your heart and how well your heart's chambers and valves are working. This procedure takes approximately one hour. There are no restrictions for this procedure.  Stress Myov[facility]): September 5 at 11:45 am at [facility].  Nothing to eat/drink after midnight.  You may take your medications as usual with a sip of water.  Where comfortable walking shoes.  Your physician wants you to follow-up in: 3 months with Dr. Elease Hashimoto. You will receive a reminder letter in the mail two months in advance. If you don't receive a letter, please call our office to schedule the follow-up appointment.

## 2012-03-25 NOTE — Assessment & Plan Note (Signed)
The marrow has a remote history of chest pain and has had a normal stress test in the past. Has history of hypertriglyceridemia and his levels have been better controlled recently.  His son busily died at age 52 in his sleep. He was thought to have had a myocardial infarction. Brandon Farrell is here today for evaluation and to discuss his risk factors.  We will do a stress Myoview study for further evaluation of the possibility of coronary artery disease. Also like to do an echocardiogram to evaluate his cardiac structure and to make sure that he does not have left ventricular hypertrophy. He does have a soft systolic murmur.  I told him that we do not have any way to predict the future and that these test cannot predict the future either way but it would be comforting to know whether or not he has structural heart disease or any evidence of coronary artery disease at this time.  I'll see him again in 3 months for followup office visit.

## 2012-04-01 ENCOUNTER — Encounter (HOSPITAL_COMMUNITY): Payer: Self-pay | Admitting: Radiology

## 2012-04-01 ENCOUNTER — Ambulatory Visit (HOSPITAL_COMMUNITY): Payer: BC Managed Care – PPO | Attending: Cardiology | Admitting: Radiology

## 2012-04-01 VITALS — BP 121/78 | Ht 62.0 in | Wt 190.0 lb

## 2012-04-01 DIAGNOSIS — R5383 Other fatigue: Secondary | ICD-10-CM | POA: Insufficient documentation

## 2012-04-01 DIAGNOSIS — R0602 Shortness of breath: Secondary | ICD-10-CM

## 2012-04-01 DIAGNOSIS — R079 Chest pain, unspecified: Secondary | ICD-10-CM

## 2012-04-01 DIAGNOSIS — E781 Pure hyperglyceridemia: Secondary | ICD-10-CM

## 2012-04-01 DIAGNOSIS — Z8249 Family history of ischemic heart disease and other diseases of the circulatory system: Secondary | ICD-10-CM | POA: Insufficient documentation

## 2012-04-01 DIAGNOSIS — E782 Mixed hyperlipidemia: Secondary | ICD-10-CM

## 2012-04-01 DIAGNOSIS — R0609 Other forms of dyspnea: Secondary | ICD-10-CM | POA: Insufficient documentation

## 2012-04-01 DIAGNOSIS — R5381 Other malaise: Secondary | ICD-10-CM | POA: Insufficient documentation

## 2012-04-01 DIAGNOSIS — R0989 Other specified symptoms and signs involving the circulatory and respiratory systems: Secondary | ICD-10-CM | POA: Insufficient documentation

## 2012-04-01 DIAGNOSIS — R42 Dizziness and giddiness: Secondary | ICD-10-CM | POA: Insufficient documentation

## 2012-04-01 MED ORDER — TECHNETIUM TC 99M TETROFOSMIN IV KIT
30.0000 | PACK | Freq: Once | INTRAVENOUS | Status: AC | PRN
  Administered 2012-04-01: 30 via INTRAVENOUS

## 2012-04-01 MED ORDER — TECHNETIUM TC 99M TETROFOSMIN IV KIT
10.0000 | PACK | Freq: Once | INTRAVENOUS | Status: AC | PRN
  Administered 2012-04-01: 10 via INTRAVENOUS

## 2012-04-01 NOTE — Progress Notes (Signed)
[facility] SITE 3 NUCLEAR MED [address] [place] [address] [phone]  Cardiology Nuclear Med Study  Brandon Farrell is a 51 y.o. male     MRN : [id number]     DOB: [birth year]  Procedure Date: 04/01/2012  Nuclear Med Background Indication for Stress Test:  Evaluation for Ischemia History:  2008 MPS EF 66% Normal Cardiac Risk Factors: Family History - CAD and Lipids  Symptoms:  Dizziness, DOE, Fatigue and SOB   Nuclear Pre-Procedure Caffeine/Decaff Intake:  None NPO After: 8:00pm   Lungs:  clear O2 Sat: 99% on room air. IV 0.9% NS with Angio Cath:  18g  IV Site: R Antecubital  IV Started by:  Stanton Kidney, EMT-P  Chest Size (in):  42  Cup Size: n/a  Height: 5\' 2"  (1.575 m)  Weight:  190 lb (86.183 kg)  BMI:  Body mass index is 34.75 kg/(m^2). Tech Comments:  NA    Nuclear Med Study 1 or 2 day study: 1 day  Stress Test Type:  Stress  Reading MD: Cassell Clement, MD  Order Authorizing Provider:  Kristeen Miss, MD  Resting Radionuclide: Technetium 40m Tetrofosmin  Resting Radionuclide Dose: 11.0 mCi   Stress Radionuclide:  Technetium 39m Tetrofosmin  Stress Radionuclide Dose: 33.0 mCi           Stress Protocol Rest HR: 56 Stress HR: 160  Rest BP: 121/78 Stress BP: 175/78  Exercise Time (min): 10:00 METS: 11.7   Predicted Max HR: 168 bpm % Max HR: 95.24 bpm Rate Pressure Product: 65784   Dose of Adenosine (mg):  n/a Dose of Lexiscan: n/a mg  Dose of Atropine (mg): n/a Dose of Dobutamine: n/a mcg/kg/min (at max HR)  Stress Test Technologist: Bonnita Levan, RN  Nuclear Technologist:  Domenic Polite, CNMT     Rest Procedure:  Myocardial perfusion imaging was performed at rest 45 minutes following the intravenous administration of Technetium 37m Tetrofosmin. Rest ECG: Sinus Bradycardia  Stress Procedure:  The patient performed treadmill exercise using a Bruce  Protocol for 10:00 minutes. The patient stopped due to fatigue and dyspnea and denied any  chest pain.  There were no significant ST-T wave changes.  Technetium 29m Tetrofosmin was injected at peak exercise and myocardial perfusion imaging was performed after a brief delay. Stress ECG: No significant change from baseline ECG  QPS Raw Data Images:  Normal; no motion artifact; normal heart/lung ratio. Stress Images:  Normal homogeneous uptake in all areas of the myocardium. Rest Images:  Normal homogeneous uptake in all areas of the myocardium. Subtraction (SDS):  No evidence of ischemia. Transient Ischemic Dilatation (Normal <1.22):  0.99 Lung/Heart Ratio (Normal <0.45):  0.31  Quantitative Gated Spect Images QGS EDV:  92 ml QGS ESV:  30 ml  Impression Exercise Capacity:  Good exercise capacity. BP Response:  Normal blood pressure response. Clinical Symptoms:  No significant symptoms noted. ECG Impression:  No significant ST segment change suggestive of ischemia. Comparison with Prior Nuclear Study: No images to compare  Overall Impression:  Normal stress nuclear study.  LV Ejection Fraction: 68%.  LV Wall Motion:  NL LV Function; NL Wall Motion  [employer]

## 2012-04-06 ENCOUNTER — Telehealth: Payer: Self-pay

## 2012-04-06 NOTE — Telephone Encounter (Signed)
Spoke with patient's daughter regarding myoview test results; per Dr. Kristin Bruins normal.

## 2012-04-06 NOTE — Telephone Encounter (Signed)
Message copied by Festus Aloe on Tue Apr 06, 2012 11:29 AM ------      Message from: [facility], MELISSA E      Created: Tue Apr 06, 2012  8:35 AM      Regarding: FW: forward test results                   ----- Message -----         From: Antony Odea, RN         Sent: 04/06/2012   8:17 AM           To: Marcelle Overlie, RN      Subject: forward test results                                                 ----- Message -----         From: Vesta Mixer, MD         Sent: 04/05/2012   8:36 PM           To: Antony Odea, RN            Normal stress Celine Ahr

## 2012-04-27 ENCOUNTER — Other Ambulatory Visit (INDEPENDENT_AMBULATORY_CARE_PROVIDER_SITE_OTHER): Payer: BC Managed Care – PPO

## 2012-04-27 ENCOUNTER — Other Ambulatory Visit: Payer: Self-pay

## 2012-04-27 DIAGNOSIS — Z8249 Family history of ischemic heart disease and other diseases of the circulatory system: Secondary | ICD-10-CM

## 2012-04-27 DIAGNOSIS — R011 Cardiac murmur, unspecified: Secondary | ICD-10-CM

## 2012-05-03 ENCOUNTER — Encounter: Payer: Self-pay | Admitting: Gastroenterology

## 2012-05-03 ENCOUNTER — Ambulatory Visit (AMBULATORY_SURGERY_CENTER): Payer: BC Managed Care – PPO | Admitting: *Deleted

## 2012-05-03 ENCOUNTER — Encounter: Payer: BC Managed Care – PPO | Admitting: Internal Medicine

## 2012-05-03 VITALS — Ht 64.0 in | Wt 190.8 lb

## 2012-05-03 DIAGNOSIS — Z1211 Encounter for screening for malignant neoplasm of colon: Secondary | ICD-10-CM

## 2012-05-03 MED ORDER — MOVIPREP 100 G PO SOLR: ORAL | Status: DC

## 2012-05-13 ENCOUNTER — Telehealth: Payer: Self-pay | Admitting: Gastroenterology

## 2012-05-17 ENCOUNTER — Encounter: Payer: BC Managed Care – PPO | Admitting: Gastroenterology

## 2012-05-17 NOTE — Telephone Encounter (Signed)
No charge this time. Next time will charge.

## 2012-06-15 ENCOUNTER — Encounter: Payer: Self-pay | Admitting: Family Medicine

## 2012-06-15 ENCOUNTER — Ambulatory Visit (INDEPENDENT_AMBULATORY_CARE_PROVIDER_SITE_OTHER): Payer: BC Managed Care – PPO | Admitting: Family Medicine

## 2012-06-15 VITALS — BP 110/80 | HR 68 | Temp 97.8°F | Wt 191.0 lb

## 2012-06-15 DIAGNOSIS — R42 Dizziness and giddiness: Secondary | ICD-10-CM

## 2012-06-15 DIAGNOSIS — H6120 Impacted cerumen, unspecified ear: Secondary | ICD-10-CM

## 2012-06-15 MED ORDER — MECLIZINE HCL 25 MG PO TABS: 25.0000 mg | ORAL_TABLET | Freq: Three times a day (TID) | ORAL | Status: DC | PRN

## 2012-06-15 NOTE — Patient Instructions (Signed)
Vrtigo (Vertigo)  Vrtigo es la sensacin de que se est moviendo estando quieto. Puede ser peligroso si ocurre cuando est trabajado, conduciendo vehculos o realizando actividades difciles.  CAUSAS   El vrtigo se produce cuando hay un conflicto en las seales que se envan al cerebro desde los sistemas visual y sensorial del cuerpo.  SNTOMAS  Puede sentir como si el mundo da vueltas o va a caer al piso. Como hay problemas en el equilibrio, el vrtigo puede causar nuseas y vmitos. Tiene movimientos oculares involuntarios (nistagmus).  DIAGNSTICO  El vrtigo normalmente se diagnostica con un examen fsico. Si la causa no se conoce, el mdico puede indicar diagnstico por imgenes, como una resonancia magntica (imgenes por Health visitor).  TRATAMIENTO  La mayor parte de los casos de vrtigo se resuelve sin TEFL teacher. Segn la causa, el mdico podr recetar ciertos medicamentos. Si se relaciona con la posicin del cuerpo, podr recomendarle movimientos o procedimientos para corregir el problema. En algunos casos raros, si la causa del vrtigo es un problema en el odo interno, necesitar [place].  INSTRUCCIONES PARA EL CUIDADO DOMICILIARIO  Siga las indicaciones del mdico.  Evite conducir vehculos.  Evite operar maquinarias pesadas.  Evite realizar tareas que seran peligrosas para usted u otras personas durante un episodio de vrtigo.  Comunquele al mdico si nota que ciertos medicamentos parecen asociarse con las crisis. Algunos medicamentos que se usan para tratar los episodios, en [employer]. SOLICITE ATENCIN MDICA DE INMEDIATO SI:  Los medicamentos no [place] las crisis o hacen que estas empeoren.  Tiene dificultad para hablar, caminar, siente debilidad o tiene problemas para [employer], las manos o las piernas.  Comienza a sufrir un dolor de cabeza intenso.  Las nuseas y los vmitos no se [place] o se Press photographer.  Aparecen  trastornos visuales.  Un miembro de su familia nota cambios en su conducta.  Hay alguna modificacin en su trastorno que parece Holiday representative de Scientist, clinical (histocompatibility and immunogenetics). ASEGRESE DE QUE:   Comprende estas instrucciones.  Controlar su enfermedad.  Solicitar ayuda de inmediato si no mejora o si empeora. Document Released: 04/23/2005 Document Revised: 10/06/2011 [facility] Patient Information 2013 [place], [place].

## 2012-06-15 NOTE — Progress Notes (Signed)
  Edrian is a very pleasant 51 yo hispanic gentleman with a hx of hyperlipidemia and family h/o CAD- unfortunately his son died of presumed MI in his sleep at age 13, here for dizziness with an interpreter.  4 days ago, felt room spinning when he changed head positions or got out of bed in the morning.  Associated with nausea, no vomiting. When he sits still and closes his eyes, symptoms resolve.  Feeling a little better today.  No CP or SOB.   Due to his son's sudden death, I did refer him to cardiology for further risk stratification.  Nuclear stress test was normal in 03/2012, yielded the following results:  Impression  Exercise Capacity: Good exercise capacity.  BP Response: Normal blood pressure response.  Clinical Symptoms: No significant symptoms noted.  ECG Impression: No significant ST segment change suggestive of ischemia.  Comparison with Prior Nuclear Study: No images to compare  Overall Impression: Normal stress nuclear study.  LV Ejection Fraction: 68%. LV Wall Motion: NL LV Function; NL Wall Motion   No HA or blurred vision. Current Outpatient Prescriptions on File Prior to Visit  Medication Sig Dispense Refill  . Choline Fenofibrate (TRILIPIX) 135 MG capsule Take 1 capsule (135 mg total) by mouth daily.  30 capsule  6  . MOVIPREP 100 G SOLR moviprep as directed. No substitutions  1 kit  0    No Known Allergies  Past Medical History  Diagnosis Date  . Hypertension   . Hypercholesterolemia   . Cholelithiasis     Past Surgical History  Procedure Date  . Knee surgery 1999    left  . Laparoscopic cholecystectomy april 2013    History  Smoking status  . Never Smoker   Smokeless tobacco  . Never Used    History  Alcohol Use  . 1.8 oz/week  . 3 Cans of beer per week    Family History  Problem Relation Age of Onset  . Heart disease Son   . Colon cancer Neg Hx   . Stomach cancer Neg Hx     Reviw of Systems:  Reviewed in the HPI.  All other systems  are negative.  Physical Exam: BP 110/80  Pulse 68  Temp 97.8 F (36.6 C)  Wt 191 lb (86.637 kg) General: Well developed, well nourished, in no acute distress. HEENT:  Mild nystagmus when laying down and head to turned to right Mild cerumen impaction right Neck: Supple. Carotids are 2 + without bruits. No JVD Lungs: Clear bilaterally to auscultation. Heart: regular rate.  normal  S1 S2. Has a very soft systolic outflow murmur. Abdomen: Soft, non-tender, non-distended with normal bowel sounds. No hepatomegaly. No rebound/guarding. No masses. Msk:  Strength and tone are normal Extremities: No clubbing or cyanosis. No edema.  Distal pedal pulses are 2+ and equal bilaterally. Neuro: Alert and oriented X 3. Moves all extremities spontaneously. Psych:  Responds to questions appropriately with a normal affect.    Assessment / Plan:  1. Vertigo   Likely due to cerumen impaction, right. Cerumen removed in office. Start Meclizine as needed- see pt instructions for details. Call or return to clinic prn if these symptoms worsen or fail to improve as anticipated.  2.  Cerumen impaction- Ceruminosis is noted.  Wax is removed by syringing and manual debridement. Instructions for home care to prevent wax buildup are given.

## 2012-06-27 ENCOUNTER — Other Ambulatory Visit: Payer: Self-pay | Admitting: Family Medicine

## 2012-06-30 ENCOUNTER — Encounter: Payer: Self-pay | Admitting: Cardiovascular Disease

## 2012-06-30 ENCOUNTER — Ambulatory Visit (INDEPENDENT_AMBULATORY_CARE_PROVIDER_SITE_OTHER): Payer: BC Managed Care – PPO | Admitting: Cardiovascular Disease

## 2012-06-30 VITALS — BP 119/72 | HR 62 | Ht 66.0 in | Wt 194.2 lb

## 2012-06-30 DIAGNOSIS — I10 Essential (primary) hypertension: Secondary | ICD-10-CM

## 2012-06-30 DIAGNOSIS — R42 Dizziness and giddiness: Secondary | ICD-10-CM

## 2012-06-30 DIAGNOSIS — R079 Chest pain, unspecified: Secondary | ICD-10-CM

## 2012-06-30 NOTE — Assessment & Plan Note (Signed)
Brandon Farrell is doing well.  He has not had any additional episodes of chest discomfort. His stress Myoview study was normal. He had an echocardiogram which was also normal.  At this point I think that his heart is healthy. I will not make him a return appointment but will be glad to see him in the future. He will followup with his general medical Dr.

## 2012-06-30 NOTE — Progress Notes (Addendum)
    Brandon Farrell Date of Birth  1960-09-23       [facility]    [place] [address]  [address] [place], [place]  [address]   [place], [place]  [address] [phone]     [phone]   Fax  [phone]    Fax [phone]  Problem List: 1. Hypertriglyceridemia  History of Present Illness:  Brandon Farrell is a 51 yo hispanic gentleman with a hx of hyperlipidemia.  He was accompanied by a friend who serves as Equities trader. His son died suddently in his sleep at age 39.  He's here today for evaluation and risk reduction. He's not had any cardiac complaints.   Brandon Farrell remains quite active. He plays soccer on regular basis. He's never had episodes of chest pain or shortness breath.  He owns [employer].  He has a history of hyperlipidemia and his lipids have been fairly well controlled.  Dec. 4, 2013: Following his initial consultation in August we performed an echocardiogram and Myoview study. The echo revealed normal left ventricular systolic function. The Myoview  revealed no evidence of ischemia and also revealed normal left ventricular systolic function.  He hs dizziness for the past 2 weeks when he goes from a lying position to a sitting position or standing position. Pt   No current outpatient prescriptions on file prior to visit.    No Known Allergies  Past Medical History  Diagnosis Date  . Hypertension   . Hypercholesterolemia   . Cholelithiasis     Past Surgical History  Procedure Date  . Knee surgery 1999    left  . Laparoscopic cholecystectomy april 2013    History  Smoking status  . Never Smoker   Smokeless tobacco  . Never Used    History  Alcohol Use  . 1.8 oz/week  . 3 Cans of beer per week    Family History  Problem Relation Age of Onset  . Heart disease Son   . Colon cancer Neg Hx   . Stomach cancer Neg Hx     Reviw of Systems:  Reviewed in the HPI.  All other systems are  negative.  Physical Exam: Blood pressure 119/72, pulse 62, height 5\' 6"  (1.676 m), weight 194 lb 4 oz (88.111 kg). General: Well developed, well nourished, in no acute distress.  Head: Normocephalic, atraumatic, sclera non-icteric, mucus membranes are moist,   Neck: Supple. Carotids are 2 + without bruits. No JVD  Lungs: Clear bilaterally to auscultation.  Heart: regular rate.  normal  S1 S2. Has a very soft systolic outflow murmur.  Abdomen: Soft, non-tender, non-distended with normal bowel sounds. No hepatomegaly. No rebound/guarding. No masses.  Msk:  Strength and tone are normal  Extremities: No clubbing or cyanosis. No edema.  Distal pedal pulses are 2+ and equal bilaterally.  Neuro: Alert and oriented X 3. Moves all extremities spontaneously.  Psych:  Responds to questions appropriately with a normal affect.  ECG: 06/30/2012-normal sinus rhythm at 60 beats a minute. Has no ST or T wave changes..  Assessment / Plan:

## 2012-06-30 NOTE — Assessment & Plan Note (Signed)
His symptoms are consistent with orthostatic hypotension. He's never had episodes of syncope while sitting or lying down. The symptoms do not sound cardiac to.  I have recommended that he drink more fluids. I've recommended also that he drinks an occasional V8 juice.  Followup with his general medical Dr.

## 2012-06-30 NOTE — Patient Instructions (Addendum)
Your physician wants you to follow-up in: as needed with Dr. Elease Hashimoto. You will receive a reminder letter in the mail two months in advance. If you don't receive a letter, please call our office to schedule the follow-up appointment.  Try to stay well hydrated and may want to try drinking V8 Juice

## 2012-08-04 ENCOUNTER — Other Ambulatory Visit: Payer: Self-pay | Admitting: Family Medicine

## 2012-08-04 ENCOUNTER — Encounter: Payer: Self-pay | Admitting: Family Medicine

## 2012-08-04 ENCOUNTER — Ambulatory Visit (INDEPENDENT_AMBULATORY_CARE_PROVIDER_SITE_OTHER): Payer: BC Managed Care – PPO | Admitting: Family Medicine

## 2012-08-04 ENCOUNTER — Ambulatory Visit (INDEPENDENT_AMBULATORY_CARE_PROVIDER_SITE_OTHER)
Admission: RE | Admit: 2012-08-04 | Discharge: 2012-08-04 | Disposition: A | Discharge status: Home / Self Care | Payer: BC Managed Care – PPO | Source: Ambulatory Visit | Attending: Family Medicine | Admitting: Family Medicine

## 2012-08-04 VITALS — BP 120/78 | HR 68 | Temp 97.9°F | Wt 193.0 lb

## 2012-08-04 DIAGNOSIS — M545 Low back pain, unspecified: Secondary | ICD-10-CM

## 2012-08-04 DIAGNOSIS — M4310 Spondylolisthesis, site unspecified: Secondary | ICD-10-CM

## 2012-08-04 DIAGNOSIS — M79605 Pain in left leg: Secondary | ICD-10-CM

## 2012-08-04 MED ORDER — DEXAMETHASONE SOD PHOSPHATE PF 10 MG/ML IJ SOLN
10.0000 mg | Freq: Once | INTRAMUSCULAR | Status: AC
  Administered 2012-08-04: 10 mg via INTRAMUSCULAR

## 2012-08-04 NOTE — Patient Instructions (Signed)
Ciática  °(Sciatica) ° La ciática es el dolor, debilidad, entumecimiento u hormigueo a lo largo del nervio ciático. El nervio comienza en la zona inferior de la espalda y desciende por la parte posterior de cada pierna. El nervio controla los músculos de la parte inferior de la pierna y de la zona posterior de la rodilla, y transmite la sensibilidad a la parte posterior del muslo, la pierna y la planta del pie. La ciática es un síntoma de otras afecciones médicas. Por ejemplo, un daño a los nervios o algunas enfermedades como un disco herniado o un espolón óseo en la columna vertebral, podrían dañarle o presionar en el nervio ciático. Esto causa dolor, debilidad y otras sensaciones normalmente asociadas con la ciática. Generalmente la ciática afecta sólo un lado del cuerpo. °CAUSAS  °· Disco herniado o desplazado. °· Enfermedad degenerativa del disco. °· Un síndrome doloroso que compromete un músculo angosto de los glúteos (síndrome piriforme). °· Lesión o fractura pélvica. °· Embarazo. °· Tumor (casos raros). °SÍNTOMAS  °Los síntomas pueden variar de leves a muy graves. Por lo general, los síntomas descienden desde la zona lumbar a las nalgas y la parte posterior de la pierna. Ellos son:  °· Hormigueo leve o dolor sordo en la parte inferior de la espalda, la pierna o la cadera. °· Adormecimiento en la parte posterior de la pantorrilla o la planta del pie. °· Sensación de quemazón en la zona lumbar, la pierna o la cadera. °· Dolor agudo en la zona inferior de la espalda, la pierna o la cadera. °· Debilidad en las piernas. °· Dolor de espalda intenso que inhibe los movimientos. °Los síntomas pueden empeorar al toser, estornudar, reír o estar sentado o parado durante mucho tiempo. Además, el sobrepeso puede empeorar los síntomas.  °DIAGNÓSTICO  °Su médico le hará un examen físico para buscar los síntomas comunes de la ciática. Le pedirá que haga algunos movimientos o actividades que activarían el dolor del nervio  ciático. Para encontrar las causas de la ciática podrá indicarle otros estudios. Estos pueden ser:  °· Análisis de sangre. °· Radiografías. °· Pruebas de diagnóstico por imágenes, como resonancia magnética o tomografía computada. °TRATAMIENTO  °El tratamiento se dirige a las causas de la ciática. A veces, el tratamiento no es necesario, y el dolor y el malestar desaparecen por sí mismos. Si necesita tratamiento, su médico puede sugerir:  °· Medicamentos de venta libre para aliviar el dolor. °· Medicamentos recetados, como antiinflamatorios, relajantes musculares o narcóticos. °· Aplicación de calor o hielo en la zona del dolor. °· Inyecciones de corticoides para disminuir el dolor, la irritación y la inflamación alrededor del nervio. °· Reducción de la actividad en los períodos de dolor. °· Ejercicios y estiramiento del abdomen para fortalecer y mejorar la flexibilidad de la columna vertebral. Su médico puede sugerirle perder peso si el peso extra empeora el dolor de espalda. °· Fisioterapia. °· La cirugía para eliminar lo que presiona o pincha el nervio, como un espolón óseo o parte de una hernia de disco. °INSTRUCCIONES PARA EL CUIDADO EN EL HOGAR  °· Sólo tome medicamentos de venta libre o recetados para calmar el dolor o el malestar, según las indicaciones de su médico. °· Aplique hielo sobre el área dolorida durante 20 minutos 3-4 veces por día durante los primeras 48-72 horas. Luego intente aplicar calor de la misma manera. °· Haga ejercicios, elongue o realice sus actividades habituales, si no le causan más dolor. °· Cumpla con todas las sesiones de fisioterapia, según le   indique su mdico.  Cumpla con todas las visitas de control, segn le indique su mdico.  No use tacones altos o zapatos que no tengan buen apoyo.  Verifique que el colchn no sea muy blando. Un colchn firme Engineer, materials y las [place]. SOLICITE ATENCIN MDICA DE INMEDIATO SI:   Pierde el control de la vejiga o del intestino  (incontinencia).  Aumenta la debilidad en la zona inferior de la espalda, la pelvis, las nalgas o las piernas.  Siente irritacin o inflamacin en la espalda.  Tiene sensacin de ardor al [employer].  El dolor empeora cuando se acuesta o lo despierta por la noche.  El dolor es peor del que experiment en el pasado.  Dura ms de 4 semanas.  Pierde peso sin motivo de [employer] sbita. ASEGRESE DE QUE:   Comprende estas instrucciones.  Controlar su enfermedad.  Solicitar ayuda de inmediato si no mejora o si empeora. Document Released: 07/14/2005 Document Revised: 01/13/2012 [facility] Patient Information 2013 [place], [place].

## 2012-08-04 NOTE — Progress Notes (Signed)
SUBJECTIVE:  Brandon Farrell is a 52 y.o. male who complains of low back pain for 2 year(s), positional with bending or lifting, with radiation down the legs. Precipitating factors: recent heavy lifting. Prior history of back problems: recurrent self limited episodes of low back pain in the past. There is no numbness in the legs.  Patient Active Problem List  Diagnosis  . HYPERTRIGLYCERIDEMIA  . HYPERLIPIDEMIA  . OBESITY  . GERD  . PROSTATITIS, ACUTE  . VITILIGO  . HYPERGLYCEMIA  . Routine general medical examination at a health care facility  . Family history of MI (myocardial infarction)   Past Medical History  Diagnosis Date  . Hypertension   . Hypercholesterolemia   . Cholelithiasis    Past Surgical History  Procedure Date  . Knee surgery 1999    left  . Laparoscopic cholecystectomy april 2013   History  Substance Use Topics  . Smoking status: Never Smoker   . Smokeless tobacco: Never Used  . Alcohol Use: 1.8 oz/week    3 Cans of beer per week   Family History  Problem Relation Age of Onset  . Heart disease Son   . Colon cancer Neg Hx   . Stomach cancer Neg Hx    No Known Allergies Current Outpatient Prescriptions on File Prior to Visit  Medication Sig Dispense Refill  . Choline Fenofibrate 135 MG capsule Take 135 mg by mouth daily.      . Ibuprofen (ADVIL PO) Take by mouth as needed.       The PMH, PSH, Social History, Family History, Medications, and allergies have been reviewed in [facility], and have been updated if relevant.  OBJECTIVE: BP 120/78  Pulse 68  Temp 97.9 F (36.6 C)  Wt 193 lb (87.544 kg)  Patient appears to be in mild to moderate pain, antalgic gait noted. Lumbosacral spine area reveals no local tenderness or mass.  Painful and reduced LS ROM noted. Straight leg raise is positive at 90 degrees on left. DTR's, motor strength and sensation normal, including heel and toe gait.  Peripheral pulses are palpable. X-Ray: ordered, but results not yet  available.  ASSESSMENT:  herniated disc likely at L4-5/sciatica  PLAN: Decadron given in office today. Will get lumbar xray today given duration of symptoms, will likely need MRI. For acute pain, rest, intermittent application of heat (do not sleep on heating pad), analgesics and muscle relaxants are recommended. Discussed longer term treatment plan of prn NSAID's and discussed a home back care exercise program with flexion exercise routine. Proper lifting with avoidance of heavy lifting discussed. Consider Physical Therapy and XRay studies if not improving. Call or return to clinic prn if these symptoms worsen or fail to improve as anticipated.

## 2012-08-04 NOTE — Addendum Note (Signed)
Addended by: Eliezer Bottom on: 08/04/2012 10:38 AM   Modules accepted: Orders

## 2013-02-04 ENCOUNTER — Other Ambulatory Visit: Payer: Self-pay | Admitting: Family Medicine

## 2013-03-08 ENCOUNTER — Encounter: Payer: Self-pay | Admitting: Family Medicine

## 2013-03-08 ENCOUNTER — Ambulatory Visit (INDEPENDENT_AMBULATORY_CARE_PROVIDER_SITE_OTHER): Payer: BC Managed Care – PPO | Admitting: Family Medicine

## 2013-03-08 VITALS — BP 120/72 | HR 60 | Temp 98.2°F | Ht 64.0 in | Wt 199.0 lb

## 2013-03-08 DIAGNOSIS — H8110 Benign paroxysmal vertigo, unspecified ear: Secondary | ICD-10-CM

## 2013-03-08 DIAGNOSIS — E7800 Pure hypercholesterolemia, unspecified: Secondary | ICD-10-CM

## 2013-03-08 DIAGNOSIS — R42 Dizziness and giddiness: Secondary | ICD-10-CM

## 2013-03-08 LAB — LIPID PANEL
Cholesterol: 198 mg/dL (ref 0–200)
Triglycerides: 134 mg/dL (ref 0.0–149.0)

## 2013-03-08 LAB — COMPREHENSIVE METABOLIC PANEL
Albumin: 4.5 g/dL (ref 3.5–5.2)
Alkaline Phosphatase: 46 U/L (ref 39–117)
BUN: 17 mg/dL (ref 6–23)
Calcium: 9.7 mg/dL (ref 8.4–10.5)
Chloride: 107 mEq/L (ref 96–112)
Glucose, Bld: 99 mg/dL (ref 70–99)
Potassium: 4.7 mEq/L (ref 3.5–5.1)

## 2013-03-08 NOTE — Progress Notes (Signed)
Spanish speaking, seen with interpreter.  Positional vertigo.  Some nausea.  A few days of sx.  Sensation of hearing loss but no tinnitus.  No other sx.  No vomiting.  Similar sx about a year ago with hearing changes.  Some better overall today.   Meds, vitals, and allergies reviewed.   ROS: See HPI.  Otherwise, noncontributory.  GEN: nad, alert and oriented HEENT: mucous membranes moist NECK: supple w/o LA CV: rrr.  PULM: ctab, no inc wob ABD: soft, +bs EXT: no edema SKIN: no acute rash CN 2-12 wnl B, S/S/DTR wnl x4 Rinne and Weber testing wnl DHP w/o any nystagmus

## 2013-03-08 NOTE — Patient Instructions (Addendum)
See Shirlee Limerick about your referral before you leave today. Take meclizine for the motion sickness if needed.

## 2013-03-09 ENCOUNTER — Encounter: Payer: Self-pay | Admitting: Family Medicine

## 2013-03-09 DIAGNOSIS — R42 Dizziness and giddiness: Secondary | ICD-10-CM | POA: Insufficient documentation

## 2013-03-09 NOTE — Assessment & Plan Note (Signed)
Likely second episode lifetime with sensation of hearing loss prev.  Would refer to ENT.  ddx d/w pt- BPV, meniere's, labyrinthitis.  Okay for outpatient f/u with meclizine prn.  He agrees.

## 2013-04-27 ENCOUNTER — Encounter: Payer: Self-pay | Admitting: Cardiovascular Disease

## 2013-04-27 ENCOUNTER — Ambulatory Visit (INDEPENDENT_AMBULATORY_CARE_PROVIDER_SITE_OTHER): Payer: BC Managed Care – PPO | Admitting: Cardiovascular Disease

## 2013-04-27 VITALS — BP 120/80 | HR 64 | Ht 65.5 in | Wt 204.5 lb

## 2013-04-27 DIAGNOSIS — R002 Palpitations: Secondary | ICD-10-CM

## 2013-04-27 DIAGNOSIS — R42 Dizziness and giddiness: Secondary | ICD-10-CM

## 2013-04-27 DIAGNOSIS — R0602 Shortness of breath: Secondary | ICD-10-CM

## 2013-04-27 NOTE — Patient Instructions (Addendum)
Your physician recommends that you continue on your current medications as directed. Please refer to the Current Medication list given to you today.     

## 2013-04-27 NOTE — Assessment & Plan Note (Signed)
Brandon Farrell is stable from a cardiac standpoint.  He has had some dizziness that sounds like vertigo.  He has not had any chest pains or dyspnea.   I have encouraged him to exercise as much as he can given his back pain.   I will see him on an as needed basis.

## 2013-04-27 NOTE — Progress Notes (Addendum)
Brandon Farrell Date of Birth  02/13/1961       [facility]    [place] [address]  [address] [place], [place]  [address]   [place], [place]  [address] [phone]     [phone]   Fax  [phone]    Fax [phone]  Problem List: 1. Hypertriglyceridemia  History of Present Illness:  Brandon Farrell is a 52 yo hispanic gentleman with a hx of hyperlipidemia.  He was accompanied by a friend who serves as Equities trader. His son died suddently in his sleep at age 66.  He's here today for evaluation and risk reduction. He's not had any cardiac complaints.   Brandon Farrell remains quite active. He plays soccer on regular basis. He's never had episodes of chest pain or shortness breath.  He owns [employer].  He has a history of hyperlipidemia and his lipids have been fairly well controlled.  Dec. 4, 2013: Following his initial consultation in [place] we performed an echocardiogram and Myoview study. The echo revealed normal left ventricular systolic function. The Myoview  revealed no evidence of ischemia and also revealed normal left ventricular systolic function.  He hs dizziness for the past 2 weeks when he goes from a lying position to a sitting position or standing position.   Oct. 1, 2014:  Brandon Farrell presents with some episodes of dizziness.  The symptoms were were worse when he would lie back.   He has not been playing soccer because of a back issue.   He has had a normal echo and a normal myoview study.    He has not had any chest pain or dyspnea.   Current Outpatient Prescriptions on File Prior to Visit  Medication Sig Dispense Refill  . Choline Fenofibrate (FENOFIBRIC ACID) 135 MG CPDR TAKE 1 CAPSULE EVERY DAY  30 capsule  2   No current facility-administered medications on file prior to visit.    No Known Allergies  Past Medical History  Diagnosis Date  . Hypertension   . Hypercholesterolemia   . Cholelithiasis     Past  Surgical History  Procedure Laterality Date  . Knee surgery  1999    left  . Laparoscopic cholecystectomy  april 2013    History  Smoking status  . Never Smoker   Smokeless tobacco  . Never Used    History  Alcohol Use  . 1.8 oz/week  . 3 Cans of beer per week    Family History  Problem Relation Age of Onset  . Heart disease Son   . Colon cancer Neg Hx   . Stomach cancer Neg Hx     Reviw of Systems:  Reviewed in the HPI.  All other systems are negative.  Physical Exam: Blood pressure 120/80, pulse 64, height 5' 5.5" (1.664 m), weight 204 lb 8 oz (92.761 kg). General: Well developed, well nourished, in no acute distress.  Head: Normocephalic, atraumatic, sclera non-icteric, mucus membranes are moist,   Neck: Supple. Carotids are 2 + without bruits. No JVD  Lungs: Clear bilaterally to auscultation.  Heart: regular rate.  normal  S1 S2. Has a very soft systolic outflow murmur.  Abdomen: Soft, non-tender, non-distended with normal bowel sounds. No hepatomegaly. No rebound/guarding. No masses.  Msk:  Strength and tone are normal  Extremities: No clubbing or cyanosis. No edema.  Distal pedal pulses are 2+ and equal bilaterally.  Neuro: Alert and oriented X 3. Moves all extremities spontaneously.  Psych:  Responds to questions appropriately with a normal affect.  ECG: Oct. 1, 2014:  NSR at 64.  Normal ECG  Assessment / Plan:

## 2013-05-24 ENCOUNTER — Other Ambulatory Visit: Payer: Self-pay | Admitting: Family Medicine

## 2013-12-04 ENCOUNTER — Other Ambulatory Visit: Payer: Self-pay | Admitting: Family Medicine

## 2013-12-22 ENCOUNTER — Ambulatory Visit (INDEPENDENT_AMBULATORY_CARE_PROVIDER_SITE_OTHER): Payer: BC Managed Care – PPO | Admitting: Internal Medicine

## 2013-12-22 ENCOUNTER — Encounter: Payer: Self-pay | Admitting: Internal Medicine

## 2013-12-22 VITALS — BP 116/68 | HR 60 | Temp 98.5°F | Wt 199.0 lb

## 2013-12-22 DIAGNOSIS — M129 Arthropathy, unspecified: Secondary | ICD-10-CM

## 2013-12-22 DIAGNOSIS — E782 Mixed hyperlipidemia: Secondary | ICD-10-CM

## 2013-12-22 DIAGNOSIS — M1990 Unspecified osteoarthritis, unspecified site: Secondary | ICD-10-CM

## 2013-12-22 DIAGNOSIS — E785 Hyperlipidemia, unspecified: Secondary | ICD-10-CM

## 2013-12-22 LAB — LIPID PANEL
CHOLESTEROL: 184 mg/dL (ref 0–200)
HDL: 30.9 mg/dL — ABNORMAL LOW (ref >39.00)
LDL Cholesterol: 131 mg/dL — ABNORMAL HIGH (ref 0–99)
TRIGLYCERIDES: 109 mg/dL (ref 0.0–149.0)
Total CHOL/HDL Ratio: 6
VLDL: 21.8 mg/dL (ref 0.0–40.0)

## 2013-12-22 LAB — COMPREHENSIVE METABOLIC PANEL
ALBUMIN: 4.2 g/dL (ref 3.5–5.2)
ALT: 30 U/L (ref 0–53)
AST: 27 U/L (ref 0–37)
Alkaline Phosphatase: 41 U/L (ref 39–117)
BUN: 20 mg/dL (ref 6–23)
CALCIUM: 9.3 mg/dL (ref 8.4–10.5)
CHLORIDE: 111 meq/L (ref 96–112)
CO2: 26 meq/L (ref 19–32)
CREATININE: 1.2 mg/dL (ref 0.4–1.5)
GFR: 68.96 mL/min (ref >60.00)
GLUCOSE: 97 mg/dL (ref 70–99)
POTASSIUM: 4.1 meq/L (ref 3.5–5.1)
Sodium: 143 mEq/L (ref 135–145)
Total Bilirubin: 0.5 mg/dL (ref 0.2–1.2)
Total Protein: 6.7 g/dL (ref 6.0–8.3)

## 2013-12-22 MED ORDER — FENOFIBRIC ACID 135 MG PO CPDR: DELAYED_RELEASE_CAPSULE | ORAL | Status: DC

## 2013-12-22 NOTE — Progress Notes (Signed)
Subjective:    Patient ID: Brandon Farrell, male    DOB: 1960-08-09, 53 y.o.   MRN: [id number]  HPI  Pt presents to the clinic today for medication refill. He needs blood work today so that he can have his fenofibrate acid refilled.  Additionally, he c/o stiffness in his hands. This started back in December. It seems to be worse first thing in the morning. He denies weakness in the hands. He does hear his joints pop and crack. He has not noticed any swelling. He does work with his hands on a daily basis. He takes Advil which makes it feel better.  Review of Systems      Past Medical History  Diagnosis Date  . Hypertension   . Hypercholesterolemia   . Cholelithiasis     Current Outpatient Prescriptions  Medication Sig Dispense Refill  . Choline Fenofibrate (FENOFIBRIC ACID) 135 MG CPDR TAKE 1 CAPSULE EVERY DAY  30 capsule  5   No current facility-administered medications for this visit.    No Known Allergies  Family History  Problem Relation Age of Onset  . Heart disease Son   . Colon cancer Neg Hx   . Stomach cancer Neg Hx     History   Social History  . Marital Status: Married    Spouse Name: N/A    Number of Children: 2  . Years of Education: N/A   Occupational History  . [employer]   .     Social History Main Topics  . Smoking status: Never Smoker   . Smokeless tobacco: Never Used  . Alcohol Use: No  . Drug Use: No  . Sexual Activity: Not on file   Other Topics Concern  . Not on file   Social History Narrative  . No narrative on file     Constitutional: Denies fever, malaise, fatigue, headache or abrupt weight changes.  Respiratory: Denies difficulty breathing, shortness of breath, cough or sputum production.   Cardiovascular: Denies chest pain, chest tightness, palpitations or swelling in the hands or feet.  Musculoskeletal: Pt reports joint stiffness. Denies decrease in range of motion, difficulty with gait, muscle pain or joint pain and  swelling.    No other specific complaints in a complete review of systems (except as listed in HPI above).  Objective:   Physical Exam   BP 116/68  Pulse 60  Temp(Src) 98.5 F (36.9 C) (Oral)  Wt 199 lb (90.266 kg)  SpO2 98% Wt Readings from Last 3 Encounters:  12/22/13 199 lb (90.266 kg)  04/27/13 204 lb 8 oz (92.761 kg)  03/08/13 199 lb (90.266 kg)    General: Appears his stated age, well developed, well nourished in NAD. Cardiovascular: Normal rate and rhythm. S1,S2 noted.  No murmur, rubs or gallops noted. No JVD or BLE edema. No carotid bruits noted. Pulmonary/Chest: Normal effort and positive vesicular breath sounds. No respiratory distress. No wheezes, rales or ronchi noted.  Musculoskeletal: Normal flexion and extension of the fingers. Normal grip strength of hands bilaterally. No signs of joint swelling.     BMET    Component Value Date/Time   NA 141 03/08/2013 1023   K 4.7 03/08/2013 1023   CL 107 03/08/2013 1023   CO2 27 03/08/2013 1023   GLUCOSE 99 03/08/2013 1023   BUN 17 03/08/2013 1023   CREATININE 1.3 03/08/2013 1023   CALCIUM 9.7 03/08/2013 1023   GFRNONAA 75.39 05/22/2009 1041   GFRAA 103 07/05/2008 1106    Lipid Panel  Component Value Date/Time   CHOL 198 03/08/2013 1023   TRIG 134.0 03/08/2013 1023   HDL 29.00* 03/08/2013 1023   CHOLHDL 7 03/08/2013 1023   VLDL 26.8 03/08/2013 1023   LDLCALC 142* 03/08/2013 1023    CBC    Component Value Date/Time   WBC 6.0 06/14/2007 0947   RBC 5.15 06/14/2007 0947   HGB 16.3 06/14/2007 0947   HCT 45.9 06/14/2007 0947   PLT 192 06/14/2007 0947   MCV 89.1 06/14/2007 0947   MCHC 35.6 06/14/2007 0947   RDW 12.2 06/14/2007 0947   MONOABS 0.4 06/14/2007 0947   EOSABS 0.1 06/14/2007 0947   BASOSABS 0.0 06/14/2007 0947    Hgb A1C Lab Results  Component Value Date   HGBA1C 5.9 03/27/2011        Assessment & Plan:

## 2013-12-22 NOTE — Patient Instructions (Addendum)
Artritis inespecífica  (Arthritis, Nonspecific)  La artritis es la inflamación de una articulación. Los síntomas son dolor, enrojecimiento, calor o hinchazón. Pueden verse involucradas una o más articulaciones. Hay diferentes tipos de artritis. El médico no podrá diagnosticar inmediatamente cuál es el tipo de artritis que usted sufre.   CAUSAS  La causa más frecuente es el desgaste de la articulación (osteoartritis). Esto ocasiona lesiones en el cartílago, que puede romperse con el tiempo. Las zonas más afectadas por este tipo de artritis son las rodillas, caderas, espalda y cuello.  Otros tipos de artritis y causas frecuentes de dolor en la articulación son:  · Esguinces y otras lesiones cercanas a la articulación}. En algunos casos, esguinces y lesiones menores causan dolor e hinchazón que aparece horas más tarde.  · Artritis reumatoidea Afecta las manos, pies y rodillas. Generalmente afecta ambos lados del cuerpo al mismo tiempo. Generalmente se asocia a enfermedades crónicas, fiebre, pérdida de peso y debilidad general.  · Artritis por cristales. La gota y la pseudogota pueden causar dolor intenso agudo ocasional, enrojecimiento e hinchazón del pie, el tobillo o la rodilla.  · Artritis infecciosa. Las bacterias pueden penetrar en la articulación a través de una herida en la piel. Esto puede causar una infección en la articulación. Las bacterias y virus también pueden diseminarse a través del torrente sanguíneo y afectar las articulaciones.  · Reacciones a medicamentos, infecciosas y alérgicas. En algunos casos las articulaciones duelen levemente y están ligeramente hinchadas en este tipo de enfermedad.  SÍNTOMAS  · El dolor es el síntoma principal.  · La articulación también pueden verse roja, hinchada y caliente al tacto.  · En ciertos tipos de artritis hay fiebre o malestar general.  · En la articulación que presenta artritis sentirá dolor con el movimiento. En otros tipos de artritis hay  rigidez.  DIAGNÓSTICO:  El médico sospechará artritis basándose en la descripción de los síntomas y en el examen. Será necesario realizar pruebas para diagnosticar el tipo de artritis.  · Análisis de sangre y en algunos casos de orina.  · Radiografías y en algunos casos tomografía computada o diagnóstico por imágenes.  · La remoción del líquido de la articulación (artrocentesis) se realiza para controlar la presencia de bacterias, cristales o por otras causas. Su médico (o un especialista) adormecerán la zona de la articulación con un anestésico local y utilizarán una aguja para retirar líquido de la articulación para ser examinado. Este procedimiento es sólo mínimamente molesto.  · Aún con estas pruebas, el médico no podrá decir qué tipo de artritis usted sufre. La consulta con un especialista (reumatólogo) puede ser de utilidad.  TRATAMIENTO  El médico comentará con usted el tratamiento específico para su tipo de artritis. Si el tipo específico no puede determinarse, podrán aplicarse las siguientes recomendaciones generales.   El tratamiento para el dolor intenso de las articulaciones consiste en:  · Hacer reposo  · Elevar el miembro.  · Podrán prescribirle medicamentos antiinflamatorios (como ibuprofeno). Evite las actividades que aumenten el dolor.  · Sólo tome medicamentos de venta libre o prescriptos para calmar el dolor y las molestias, según las indicaciones de su médico.  · Puede aplicarse compresas frías sobre la articulación dolorida durante 10 a 15 minutos cada hora. Las compresas calientes también pueden ser beneficiosas, pero no las utilice durante la noche. No use compresas calientes sin autorización de su médico, si es diabético.  · Una inyección de corticoides en la articulación artrítica puede ayudar a reducir el dolor y la hinchazón.  ·   Si una artritis aguda empeora en los siguientes 1 ó 2 días, será necesario descartar una infección.  El tratamiento prolongado implica la modificación de  actividades y del estilo de vida para reducir el estrés en la articulación. Puede ser necesario que baje de peso. La actividad física es necesaria para nutrir el cartílago de la articulación y eliminar los desechos. Esto ayuda a mantener fuertes los músculos que rodean la articulación.  INSTRUCCIONES PARA EL CUIDADO DOMICILIARIO  · No tome aspirina para aliviar el dolor si se sospecha que sufre gota. Esto eleva los niveles de ácido úrico.  · Solo tome medicamentos que se pueden comprar sin receta o recetados para el dolor, malestar o fiebre, como le indica el médico.  · Haga reposo todo el tiempo que pueda.  · Si la articulación está hinchada, manténgala elevada.  · Utilice muletas si la articulación que le duele está en la pierna.  · Beber abundante cantidad de líquidos será beneficioso para ciertos tipos de artritis.  · Siga las indicaciones del profesional.  · La actividad física regular puede ser beneficiosa, incluyendo las actividades de bajo impacto como:  · Natación.  · Aquagym.  · Andar en bicicleta.  · Caminar.  · La rigidez matutina se alivia con una ducha caliente.  · También es beneficioso que realice ejercicios de amplitud de movimiento.  SOLICITE ATENCIÓN MÉDICA SI:  · No se siente mejor o empeora luego de las 24 horas.  · Presenta efectos adversos por los medicamentos y no mejora con el tratamiento.  SOLICITE ATENCIÓN MÉDICA INMEDIATAMENTE SI:  · Tiene fiebre.  · Presenta fiebre o dolor intenso, hinchazón o enrojecimiento.  · Muchas articulaciones están involucradas y están hinchadas y siente dolor.  · Tiene un dolor intenso en la espalda o siente debilidad en las piernas.  · Pierde el control de la vejiga o del intestino.  Document Released: 07/14/2005 Document Revised: 10/06/2011  ExitCare® Patient Information ©2014 [employer].

## 2013-12-22 NOTE — Assessment & Plan Note (Signed)
In hands Continue Advil as needed

## 2013-12-22 NOTE — Progress Notes (Signed)
Pre visit review using our clinic review tool, if applicable. No additional management support is needed unless otherwise documented below in the visit note. 

## 2013-12-22 NOTE — Assessment & Plan Note (Signed)
Will check lipid profile and CMET today Will refill fenofibric acid

## 2014-03-01 ENCOUNTER — Ambulatory Visit (INDEPENDENT_AMBULATORY_CARE_PROVIDER_SITE_OTHER): Payer: BC Managed Care – PPO | Admitting: Family Medicine

## 2014-03-01 ENCOUNTER — Encounter: Payer: Self-pay | Admitting: Family Medicine

## 2014-03-01 VITALS — BP 116/72 | HR 58 | Temp 97.8°F | Ht 64.0 in | Wt 202.5 lb

## 2014-03-01 DIAGNOSIS — R42 Dizziness and giddiness: Secondary | ICD-10-CM

## 2014-03-01 MED ORDER — MECLIZINE HCL 12.5 MG PO TABS: 12.5000 mg | ORAL_TABLET | Freq: Three times a day (TID) | ORAL | Status: DC | PRN

## 2014-03-01 NOTE — Assessment & Plan Note (Signed)
Deteriorated. GIven Rx for meclizine. Will get MRI of brain given duration and progression of symptoms. The patient indicates understanding of these issues and agrees with the plan.  Orders Placed This Encounter  Procedures  . MR Brain W Wo Contrast

## 2014-03-01 NOTE — Progress Notes (Signed)
Pre visit review using our clinic review tool, if applicable. No additional management support is needed unless otherwise documented below in the visit note. 

## 2014-03-01 NOTE — Progress Notes (Signed)
   Subjective:   Patient ID: Brandon Farrell, male    DOB: 02/23/1961, 53 y.o.   MRN: [id number]  Brandon Farrell is a pleasant 53 y.o. year old male who presents to clinic today with Dizziness  on 03/01/2014  HPI: His daughter is interpreting for him today.  Was seen last August for similar symptoms- dizziness when he changes head positions.  Episodes would resolve rather quickly.  Saw ENT (Dr. Damita Dunnings referred him)- was told nothing was wrong per pt.  Saw Dr. Acie Fredrickson on 04/27/13- cleared from cardiac standpoint- note reviewed.  Was in [place] past two weeks- returned last night. Had to be taken to ER there because it was lasting for 2 days with nausea and vomiting. Given injections of "steroids" per daughter.  Symptoms better but if he changes head positions quickly, they return.  No HA or blurred vision  Feel "off."  More tired, "drained."  Current Outpatient Prescriptions on File Prior to Visit  Medication Sig Dispense Refill  . Choline Fenofibrate (FENOFIBRIC ACID) 135 MG CPDR TAKE 1 CAPSULE EVERY DAY  30 capsule  5   No current facility-administered medications on file prior to visit.    No Known Allergies  Past Medical History  Diagnosis Date  . Hypertension   . Hypercholesterolemia   . Cholelithiasis     Past Surgical History  Procedure Laterality Date  . Knee surgery  1999    left  . Laparoscopic cholecystectomy  april 2013    Family History  Problem Relation Age of Onset  . Heart disease Son   . Colon cancer Neg Hx   . Stomach cancer Neg Hx     History   Social History  . Marital Status: Married    Spouse Name: N/A    Number of Children: 2  . Years of Education: N/A   Occupational History  . [employer]   .     Social History Main Topics  . Smoking status: Never Smoker   . Smokeless tobacco: Never Used  . Alcohol Use: No  . Drug Use: No  . Sexual Activity: Not on file   Other Topics Concern  . Not on file   Social History Narrative  . No  narrative on file   The PMH, PSH, Social History, Family History, Medications, and allergies have been reviewed in [facility], and have been updated if relevant.    Review of Systems See HPI No UE or LE weakness No facial droop No ear pain    Objective:    BP 116/72  Pulse 58  Temp(Src) 97.8 F (36.6 C) (Oral)  Ht 5\' 4"  (1.626 m)  Wt 202 lb 8 oz (91.853 kg)  BMI 34.74 kg/m2  SpO2 96%   Physical Exam  Gen:  Alert, pleasant, NAD Neuro:  Normal gait CN II- XII grossly intact Pos nystagmus and dizziness when laying down and turning head left.  Had to stop examination due to nausea Psych:  Good eye contact CVS:   RRR      Assessment & Plan:   Vertigo - Plan: MR Brain W Wo Contrast No Follow-up on file.

## 2014-03-01 NOTE — Patient Instructions (Signed)
Nice to meet your daughter. Please take meclizine as directed for dizziness.  Please stop by to see Rosaria Ferries on your way out to set up your MRI.  Vrtigo (Vertigo)  Vrtigo es la sensacin de que se est moviendo estando quieto. Puede sentir [employer] cosas a su alrededor se mueven cuando no lo hacen. Este problema desaparece por s mismo.  CUIDADOS EN EL [place] indicaciones del mdico.  Evite conducir vehculos.  Evite usar [employer].  Evite realizar [employer] puedan ser peligrosas.  Informe a su mdico si los medicamentos le causan vrtigo. SOLICITE AYUDA DE INMEDIATO SI:   Los medicamentos no lo ayudan o lo Conservation officer, nature.  Tiene dificultad para hablar o para caminar.  Se siente dbil o tiene problemas para [employer], las [place] piernas.  Tiene dolores de [place].  Tiene Higher education careers adviser (nuseas) y vmitos.  Hay cambios en la visin.  Algn familiar nota que usted tiene [employer].  Los sntomas empeoran. ASEGRESE DE QUE:  Comprende estas instrucciones.  Controlar su enfermedad.  Solicitar ayuda de inmediato si no mejora o empeora. Document Released: 08/16/2010 Document Revised: 10/06/2011 [facility] Patient Information 2015 [place]. This information is not intended to replace advice given to you by your health care provider. Make sure you discuss any questions you have with your health care provider.

## 2014-03-10 ENCOUNTER — Ambulatory Visit: Payer: BC Managed Care – PPO | Admitting: Nurse Practitioner

## 2014-03-10 ENCOUNTER — Ambulatory Visit (INDEPENDENT_AMBULATORY_CARE_PROVIDER_SITE_OTHER): Payer: BC Managed Care – PPO | Admitting: Nurse Practitioner

## 2014-03-10 ENCOUNTER — Telehealth: Payer: Self-pay

## 2014-03-10 ENCOUNTER — Telehealth: Payer: Self-pay | Admitting: Nurse Practitioner

## 2014-03-10 ENCOUNTER — Other Ambulatory Visit: Payer: Self-pay | Admitting: Family Medicine

## 2014-03-10 ENCOUNTER — Encounter: Payer: Self-pay | Admitting: Nurse Practitioner

## 2014-03-10 VITALS — BP 108/72 | HR 57 | Ht 64.0 in | Wt 198.8 lb

## 2014-03-10 DIAGNOSIS — Z139 Encounter for screening, unspecified: Secondary | ICD-10-CM

## 2014-03-10 DIAGNOSIS — R002 Palpitations: Secondary | ICD-10-CM

## 2014-03-10 NOTE — Telephone Encounter (Signed)
Spoke w/ pt's daughter.  Pt is agreeable to seeing Gerald Stabs today at 1:45.

## 2014-03-10 NOTE — Telephone Encounter (Signed)
Pt daughter called back states pt wants to wait and see Dr. Acie Fredrickson, I r/s to 03/22/2014

## 2014-03-10 NOTE — Progress Notes (Signed)
Patient Name: Brandon Farrell Date of Encounter: 03/10/2014  Primary Care Provider:  Arnette Norris, MD Primary Cardiologist:  Joaquim Nam, MD   Patient Profile  53 y/o male with a h/o HL/HTG who presents for f/u 2/2 palpitations.  Problem List   Past Medical History  Diagnosis Date  . Hypertension   . Hypercholesterolemia   . Cholelithiasis   . Palpitations     a. 03/2012 Ex MV: No isch/infarct;  b. 04/2012 Echo: EF 55-65%, mildly dil LA.  Marland Kitchen Vertigo    Past Surgical History  Procedure Laterality Date  . Knee surgery  1999    left  . Laparoscopic cholecystectomy  april 2013   Allergies  No Known Allergies  HPI  53 y/o male with a h/o HL/HTG on fibrate therapy.  He has previously been seen for risk factor assessment in the setting of having a son who died suddenly @ age 32.  He had a nl echo and myoview in 2010.  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 He is here with his dtr today who helps to translate.  Over the past three nights, shortly after lying down for sleep, he begins to note palpitations described extra beats followed by pauses and then a strong beat that he can feel in his neck.  This goes on for 2-4 hrs and during that time, he feels like he needs to take a good breath, but he doesn't necessarily feel short of breath.  He denies chest pain or presyncope.  His dtr called into the office this AM and he was added onto my schedule.  He has not had any palpitations during day time hours and remains active.   Home Medications  Prior to Admission medications   Medication Sig Start Date End Date Taking? Authorizing Provider  Choline Fenofibrate (FENOFIBRIC ACID) 135 MG CPDR TAKE 1 CAPSULE EVERY DAY 12/22/13  Yes Webb Silversmith, NP  meclizine (ANTIVERT) 12.5 MG tablet Take 1 tablet (12.5 mg total) by mouth 3 (three) times daily as needed for dizziness. 03/01/14  Yes Lucille Passy, MD   Review of Systems  Night-time palpitations as above.  All other systems reviewed and are  otherwise negative except as noted above.  Physical Exam  Blood pressure 108/72, pulse 57, height 5\' 4"  (1.626 m), weight 198 lb 12 oz (90.152 kg).  General: Pleasant, NAD Psych: Normal affect. Neuro: Alert and oriented X 3. Moves all extremities spontaneously. HEENT: Normal  Neck: Supple without bruits or JVD. Lungs:  Resp regular and unlabored, CTA. Heart: RRR no s3, s4, or murmurs. Abdomen: Soft, non-tender, non-distended, BS + x 4.  Extremities: No clubbing, cyanosis or edema. DP/PT/Radials 2+ and equal bilaterally.  Accessory Clinical Findings  ECG - SB 57, no acute st/t changes.  Assessment & Plan  1.  Palpitations:  Pt presents with a three day h/o nocturnal palpitations described as extra beats, pauses, and then strong beats.  Based on his description, it sounds like he may be having PVC's.  ECG today shows sinus brady w/o acute ST/T changes.  I will check a bmet, Mg, and TSH today.  I will also arrange for a 21 day event monitor to document his arrhythmia.  He had a normal echo and exercise myoview less than 2 yrs ago and has not had any change in his exercise tolerance, thus I will not repeat echo at this time.  Given baseline sinus bradycardia with soft BP, I will not add beta blocker at this  time.   2.  HL/HTG:  TC 184, TG 109, HDL 30.9 , LDL 131 in May.  LFT's wnl.  He is on a fibrate.  3. Dispo:  F/U with Dr. Acie Fredrickson as scheduled in 2 wks.   Murray Hodgkins, NP 2:27 PM 03/10/2014

## 2014-03-10 NOTE — Patient Instructions (Signed)
We will draw labs on you today: BMET, Magnesium  Your physician has recommended that you wear an event monitor. Event monitors are medical devices that record the heart's electrical activity. Doctors most often [place] these monitors to diagnose arrhythmias. Arrhythmias are problems with the speed or rhythm of the heartbeat. The monitor is a small, portable device. You can wear one while you do your normal daily activities. This is usually used to diagnose what is causing palpitations/syncope (passing out). [facility] will contact you regarding having this mailed to your home.   Please keep your followup appointment with Dr. Acie Fredrickson

## 2014-03-10 NOTE — Telephone Encounter (Signed)
Spoke w/ daughter.  Advised her that Brandon Farrell would like for pt to have labs done today and possibly monitor placement.  Asked if there is any way that pt can come in today for an appt to discuss.  She will call her father and see if he can come in today and call us back.

## 2014-03-10 NOTE — Telephone Encounter (Signed)
Pt daughter called, states the last several nights pt has been awoken with heart racing, "feels like it is going to beat out of his chest". Please call.

## 2014-03-10 NOTE — Telephone Encounter (Signed)
Patient has been waking up feeling like his heart is racing  It lasts for several minutes and then goes away on its own  His daughter stated it happened last night but he is fine now He denies any other symptoms  He has not checked his pulse or blood pressure during these episodes  I advised patient to keep his 03/13/14 appt with Ignacia Bayley Educated patient and daughter on how to take pulse  Instructed him to check his pulse and and record it if he has another episode  Instructed patient to contact ems if he felt his situation becomes emergent

## 2014-03-10 NOTE — Telephone Encounter (Signed)
Would it be possible to have him come in for a bmet, mg, tsh, and event monitor today?  I could even see him today if you'd like to open a slot for me this afternoon.

## 2014-03-11 LAB — BASIC METABOLIC PANEL
BUN/Creatinine Ratio: 18 (ref 9–20)
BUN: 20 mg/dL (ref 6–24)
CHLORIDE: 103 mmol/L (ref 97–108)
CO2: 20 mmol/L (ref 18–29)
Calcium: 9.6 mg/dL (ref 8.7–10.2)
Creatinine, Ser: 1.14 mg/dL (ref 0.76–1.27)
GFR calc non Af Amer: 73 mL/min/{1.73_m2} (ref >59)
GFR, EST AFRICAN AMERICAN: 84 mL/min/{1.73_m2} (ref >59)
GLUCOSE: 90 mg/dL (ref 65–99)
POTASSIUM: 4.5 mmol/L (ref 3.5–5.2)
Sodium: 141 mmol/L (ref 134–144)

## 2014-03-11 LAB — MAGNESIUM: MAGNESIUM: 2.2 mg/dL (ref 1.6–2.6)

## 2014-03-11 LAB — TSH: TSH: 2.96 u[IU]/mL (ref 0.450–4.500)

## 2014-03-13 ENCOUNTER — Ambulatory Visit
Admission: RE | Admit: 2014-03-13 | Discharge: 2014-03-13 | Disposition: A | Discharge status: Home / Self Care | Payer: BC Managed Care – PPO | Source: Ambulatory Visit | Attending: Family Medicine | Admitting: Family Medicine

## 2014-03-13 ENCOUNTER — Ambulatory Visit: Payer: BC Managed Care – PPO | Admitting: Nurse Practitioner

## 2014-03-13 DIAGNOSIS — Z139 Encounter for screening, unspecified: Secondary | ICD-10-CM

## 2014-03-13 DIAGNOSIS — R42 Dizziness and giddiness: Secondary | ICD-10-CM

## 2014-03-13 MED ORDER — GADOBENATE DIMEGLUMINE 529 MG/ML IV SOLN
20.0000 mL | Freq: Once | INTRAVENOUS | Status: AC | PRN
  Administered 2014-03-13: 20 mL via INTRAVENOUS

## 2014-03-14 ENCOUNTER — Telehealth: Payer: Self-pay | Admitting: Family Medicine

## 2014-03-14 NOTE — Telephone Encounter (Signed)
Mickel Baas returned your call.

## 2014-03-14 NOTE — Telephone Encounter (Signed)
Pt daughter Mickel Baas returned your call.   [phone]

## 2014-03-14 NOTE — Telephone Encounter (Signed)
Spoke to Brandon Farrell and informed her of pts results. Pt does not speak [place]. See MRI results

## 2014-03-14 NOTE — Telephone Encounter (Signed)
Lm on pts daughter's vm; see MRI results

## 2014-03-17 DIAGNOSIS — R002 Palpitations: Secondary | ICD-10-CM

## 2014-03-20 ENCOUNTER — Telehealth: Payer: Self-pay | Admitting: Family Medicine

## 2014-03-20 NOTE — Telephone Encounter (Signed)
Patient Information:  Caller Name: Mickel Baas  Phone: [phone]  Patient: Brandon Farrell, Brandon Farrell  Gender: Male  DOB: [birth year]  Age: 53 Years  PCP: Arnette Norris [facility])  Office Follow Up:  Does the office need to follow up with this patient?: Yes  Instructions For The Office: Please follow up with patient regarding appointment request for 03/21/14.  Advised to resume Rx  Antivert for vertigo.   Symptoms  Reason For Call & Symptoms: Daughter/ Mickel Baas calling. Reports he had MRI done last week and labs following episode of vertigo on 03/01/14; he woke again with dizziness 03/20/14.  He took Antivert for 3 days only as symptoms improved.  Reviewed Health History In EMR: Yes  Reviewed Medications In EMR: Yes  Reviewed Allergies In EMR: Yes  Reviewed Surgeries / Procedures: Yes  Date of Onset of Symptoms: 03/20/2014  Guideline(s) Used:  Dizziness  Disposition Per Guideline:   Go to Office Now  Reason For Disposition Reached:   Spinning or tilting sensation (vertigo) present now  Advice Given:  Some Causes of Temporary Dizziness:  Poor Fluid Intake - Not drinking enough fluids and being a little dehydrated is a common cause of temporary dizziness. This is always worse during hot weather.  Standing Up Suddenly - Standing up suddenly (especially getting out of bed) or prolonged standing in one place are common causes of temporary dizziness. Not drinking enough fluids always makes it worse. Certain medications can cause or increase this type of dizziness (e.g., blood pressure medications).  Heat Exposure - Hot weather, hot tubs, or too much sun exposure are common causes of temporary dizziness. Not drinking enough fluids always makes it worse.  Drink Fluids:  Drink several glasses of fruit juice, other clear fluids, or water. This will improve hydration and blood glucose. If you have a fever or have had heat exposure, make sure the fluids are cold.  Cool Off:  If the weather is hot, apply a  cold compress to the forehead or take a cool shower or bath.  Stand Up Slowly:  In the mornings, sit up for a few minutes before you stand up. That will help your blood flow make the adjustment.  Sit down or lie down if you feel dizzy.  Call Back If:  Passes out (faints)  You become worse.  Patient Refused Recommendation:  Patient Refused Appt, Patient Requests Appt At Later Date  No appointments available at [facility]; declined appointment at [facility] location.

## 2014-03-21 ENCOUNTER — Ambulatory Visit: Payer: BC Managed Care – PPO | Admitting: Nurse Practitioner

## 2014-03-21 NOTE — Telephone Encounter (Signed)
Spoke to pts daughter, Mickel Baas, states that the pt woke up this am feeling better. He has been up and walking around, but feels as though his L ear is "clogged."  I advised daughter to contact ENT since he is already established with them, to see if there is a way for him to be seen there.

## 2014-03-21 NOTE — Telephone Encounter (Signed)
Please call to check on pt. 

## 2014-03-22 ENCOUNTER — Encounter: Payer: Self-pay | Admitting: Cardiovascular Disease

## 2014-03-22 ENCOUNTER — Ambulatory Visit (INDEPENDENT_AMBULATORY_CARE_PROVIDER_SITE_OTHER): Payer: BC Managed Care – PPO | Admitting: Cardiovascular Disease

## 2014-03-22 VITALS — BP 112/80 | HR 68 | Ht 64.0 in | Wt 203.0 lb

## 2014-03-22 DIAGNOSIS — R002 Palpitations: Secondary | ICD-10-CM

## 2014-03-22 DIAGNOSIS — R42 Dizziness and giddiness: Secondary | ICD-10-CM

## 2014-03-22 NOTE — Assessment & Plan Note (Signed)
He was having some palpitations when he saw our PA in several weeks ago. So far his event monitor has not shown any heart irregularities.  I Suspect that  he's not sleeping very well and that this is causing his fatigue and palpitations.  I have asked him to contact his medical doctor for further suggestions about his sleep.   I will see him 6 months.

## 2014-03-22 NOTE — Progress Notes (Signed)
Brandon Farrell Date of Birth  10-Dec-1960       [facility]    [employer] [address], [address], [place], [place]  [address]   [place], [place]  [address] [phone]     [phone]   Fax  [phone]    Fax [phone]  Problem List: 1. Hypertriglyceridemia  History of Present Illness:  Brandon Farrell is a 53 yo hispanic gentleman with a hx of hyperlipidemia.  He was accompanied by a friend who serves as Astronomer. His son died 43 in his sleep at age 16.  He's here today for evaluation and risk reduction. He's not had any cardiac complaints.   Brandon Farrell remains quite active. He plays soccer on regular basis. He's never had episodes of chest pain or shortness breath.  He owns [employer].  He has a history of hyperlipidemia and his lipids have been fairly well controlled.  Dec. 4, 2013: Following his initial consultation in August we performed an echocardiogram and Myoview study. The echo revealed normal left ventricular systolic function. The Myoview  revealed no evidence of ischemia and also revealed normal left ventricular systolic function.  He hs dizziness for the past 2 weeks when he goes from a lying position to a sitting position or standing position.   Oct. 1, 2014:  Brandon Farrell presents with some episodes of dizziness.  The symptoms were were worse when he would lie back.   He has not been playing soccer because of a back issue.   He has had a normal echo and a normal myoview study.    He has not had any chest pain or dyspnea.   March 22, 2014:  Brandon Farrell is seen today for followup visit. He was seen by Ignacia Bayley, PA for some palpitations. He placed to monitor on him.   So far, there have not been any significant arrhythmias.   Current Outpatient Prescriptions on File Prior to Visit  Medication Sig Dispense Refill  . Choline Fenofibrate (FENOFIBRIC ACID) 135 MG CPDR TAKE 1 CAPSULE EVERY DAY  30 capsule  5  . meclizine  (ANTIVERT) 12.5 MG tablet Take 1 tablet (12.5 mg total) by mouth 3 (three) times daily as needed for dizziness.  30 tablet  0   No current facility-administered medications on file prior to visit.    No Known Allergies  Past Medical History  Diagnosis Date  . Hypertension   . Hypercholesterolemia   . Cholelithiasis   . Palpitations     a. 03/2012 Ex MV: No isch/infarct;  b. 04/2012 Echo: EF 55-65%, mildly dil LA.  Marland Kitchen Vertigo     Past Surgical History  Procedure Laterality Date  . Knee surgery  1999    left  . Laparoscopic cholecystectomy  april 2013    History  Smoking status  . Never Smoker   Smokeless tobacco  . Never Used    History  Alcohol Use  . 1.8 oz/week  . 3 Cans of beer per week    Family History  Problem Relation Age of Onset  . Heart disease Son   . Colon cancer Neg Hx   . Stomach cancer Neg Hx     Reviw of Systems:  Reviewed in the HPI.  All other systems are negative.  Physical Exam: Blood pressure 112/80, pulse 68, height 5\' 4"  (1.626 m), weight 203 lb (92.08 kg). General: Well developed, well nourished, in no acute distress.  Head: Normocephalic, atraumatic, sclera non-icteric,  mucus membranes are moist,   Neck: Supple. Carotids are 2 + without bruits. No JVD  Lungs: Clear bilaterally to auscultation.  Heart: regular rate.  normal  S1 S2. Has a very soft systolic outflow murmur.  Abdomen: Soft, non-tender, non-distended with normal bowel sounds. No hepatomegaly. No rebound/guarding. No masses.  Msk:  Strength and tone are normal  Extremities: No clubbing or cyanosis. No edema.  Distal pedal pulses are 2+ and equal bilaterally.  Neuro: Alert and oriented X 3. Moves all extremities spontaneously.  Psych:  Responds to questions appropriately with a normal affect.  ECG: Oct. 1, 2014:  NSR at 64.  Normal ECG  Assessment / Plan:

## 2014-03-22 NOTE — Assessment & Plan Note (Signed)
His symptoms of dizziness exercise more like vertigo than cardiac etiology. He became dizzy going to seated to a lying position. He already has an appointment with an ENT doctor.  He'll follow up with his medical Dr. and the ENT doctor.

## 2014-03-22 NOTE — Patient Instructions (Signed)
Your physician wants you to follow-up in: 6 months  You will receive a reminder letter in the mail two months in advance. If you don't receive a letter, please call our office to schedule the follow-up appointment.  Your physician recommends that you continue on your current medications as directed. Please refer to the Current Medication list given to you today.  

## 2014-03-29 ENCOUNTER — Ambulatory Visit: Payer: BC Managed Care – PPO | Admitting: Family Medicine

## 2014-05-03 ENCOUNTER — Telehealth: Payer: Self-pay | Admitting: *Deleted

## 2014-05-03 NOTE — Telephone Encounter (Signed)
Informed patient holter showed normal normal sinus rhythm per Dr. Acie Fredrickson  Patient verbalized understanding

## 2014-05-08 ENCOUNTER — Other Ambulatory Visit: Payer: Self-pay

## 2014-05-08 ENCOUNTER — Ambulatory Visit (INDEPENDENT_AMBULATORY_CARE_PROVIDER_SITE_OTHER): Payer: BC Managed Care – PPO

## 2014-05-08 DIAGNOSIS — R002 Palpitations: Secondary | ICD-10-CM

## 2014-06-28 ENCOUNTER — Telehealth: Payer: Self-pay | Admitting: *Deleted

## 2014-06-28 DIAGNOSIS — E785 Hyperlipidemia, unspecified: Secondary | ICD-10-CM

## 2014-06-28 NOTE — Telephone Encounter (Signed)
Ok to refill 

## 2014-06-28 NOTE — Telephone Encounter (Signed)
Pharmacist left msg on triage Noralee Space) stating been trying to get refill on pt fenofibric acid. Pt is out of meds. Forwarding msg to Dr. Marjory Lies @ [email or web address] not a pt at the [place] office...Johny Chess

## 2014-06-29 MED ORDER — FENOFIBRIC ACID 135 MG PO CPDR: DELAYED_RELEASE_CAPSULE | ORAL | Status: DC

## 2014-08-03 ENCOUNTER — Encounter: Payer: Self-pay | Admitting: *Deleted

## 2014-08-03 ENCOUNTER — Ambulatory Visit (INDEPENDENT_AMBULATORY_CARE_PROVIDER_SITE_OTHER): Payer: BLUE CROSS/BLUE SHIELD | Admitting: Family Medicine

## 2014-08-03 ENCOUNTER — Other Ambulatory Visit: Payer: Self-pay | Admitting: Family Medicine

## 2014-08-03 ENCOUNTER — Ambulatory Visit (INDEPENDENT_AMBULATORY_CARE_PROVIDER_SITE_OTHER)
Admission: RE | Admit: 2014-08-03 | Discharge: 2014-08-03 | Disposition: A | Discharge status: Home / Self Care | Payer: BLUE CROSS/BLUE SHIELD | Source: Ambulatory Visit | Attending: Family Medicine | Admitting: Family Medicine

## 2014-08-03 ENCOUNTER — Encounter: Payer: Self-pay | Admitting: Family Medicine

## 2014-08-03 VITALS — BP 120/80 | HR 58 | Temp 97.9°F | Wt 196.0 lb

## 2014-08-03 DIAGNOSIS — E782 Mixed hyperlipidemia: Secondary | ICD-10-CM

## 2014-08-03 DIAGNOSIS — M25562 Pain in left knee: Secondary | ICD-10-CM

## 2014-08-03 DIAGNOSIS — H1013 Acute atopic conjunctivitis, bilateral: Secondary | ICD-10-CM

## 2014-08-03 DIAGNOSIS — H04129 Dry eye syndrome of unspecified lacrimal gland: Secondary | ICD-10-CM

## 2014-08-03 DIAGNOSIS — E781 Pure hyperglyceridemia: Secondary | ICD-10-CM

## 2014-08-03 LAB — COMPREHENSIVE METABOLIC PANEL
ALT: 24 U/L (ref 0–53)
AST: 22 U/L (ref 0–37)
Albumin: 4.5 g/dL (ref 3.5–5.2)
Alkaline Phosphatase: 43 U/L (ref 39–117)
BUN: 18 mg/dL (ref 6–23)
CALCIUM: 9.4 mg/dL (ref 8.4–10.5)
CHLORIDE: 107 meq/L (ref 96–112)
CO2: 27 meq/L (ref 19–32)
CREATININE: 1.2 mg/dL (ref 0.4–1.5)
GFR: 65.56 mL/min (ref >60.00)
Glucose, Bld: 100 mg/dL — ABNORMAL HIGH (ref 70–99)
Potassium: 4.5 mEq/L (ref 3.5–5.1)
Sodium: 140 mEq/L (ref 135–145)
Total Bilirubin: 0.7 mg/dL (ref 0.2–1.2)
Total Protein: 7.2 g/dL (ref 6.0–8.3)

## 2014-08-03 LAB — LIPID PANEL
CHOLESTEROL: 189 mg/dL (ref 0–200)
HDL: 31.7 mg/dL — ABNORMAL LOW (ref >39.00)
LDL CALC: 134 mg/dL — AB (ref 0–99)
NonHDL: 157.3
Total CHOL/HDL Ratio: 6
Triglycerides: 119 mg/dL (ref 0.0–149.0)
VLDL: 23.8 mg/dL (ref 0.0–40.0)

## 2014-08-03 MED ORDER — AZELASTINE HCL 0.05 % OP SOLN: 1.0000 [drp] | Freq: Two times a day (BID) | OPHTHALMIC | Status: DC

## 2014-08-03 NOTE — Progress Notes (Signed)
Patient ID: Brandon Farrell, male   DOB: [birth year], 54 y.o.   MRN: [id number]   Subjective:   Patient ID: Brandon Farrell, male    DOB: 11/17/1960, 54 y.o.   MRN: [id number]  Brandon Farrell is a pleasant 54 y.o. year old male who presents to clinic today with a [place] speaking interpreter, here for  Follow-up; Knee Pain; and Dry Eye  on 08/03/2014  HPI:  Left knee pain- over 20 years ago, had knee surgery to "clean out the cartilage."  Surgery was done in [facility], [place] and injury prior to surgery.  Symptoms did improve after surgery but has always had more limited ROM of left knee since . He wife actually thinks his left leg looks shorter now.  Over past year, he feels he is having progressive pain behind his knee at the end of the day with intermittent swelling and warmth.  No erythema. Leg has not given out on him.  Not taking anything for it.  Bilateral eye itching- past couple of months, having bilateral eye irritation, dryness and itchiness.  Started before he went home to [place] last month but it is not getting any better.  No blurred vision.  Sometimes has sensitivity to light.  No eye pain or redness.  Last dilated eye exam last year.  Trying OTC visine without much improvement.  He is unsure if any new detergents, soaps or other potential allergens at home or at work.  Never had symptoms like this before.  No wheezes, rashes or hives.  HLD- Has been taking fenofibrate 135 mg daily. Lab Results  Component Value Date   CHOL 184 12/22/2013   HDL 30.90* 12/22/2013   LDLCALC 131* 12/22/2013   LDLDIRECT 124.7 03/27/2011   TRIG 109.0 12/22/2013   CHOLHDL 6 12/22/2013   Lab Results  Component Value Date   ALT 30 12/22/2013   AST 27 12/22/2013   ALKPHOS 41 12/22/2013   BILITOT 0.5 12/22/2013    Current Outpatient Prescriptions on File Prior to Visit  Medication Sig Dispense Refill  . Choline Fenofibrate (FENOFIBRIC ACID) 135 MG CPDR TAKE 1 CAPSULE EVERY DAY 30 capsule 0   No current  facility-administered medications on file prior to visit.    No Known Allergies  Past Medical History  Diagnosis Date  . Hypertension   . Hypercholesterolemia   . Cholelithiasis   . Palpitations     a. 03/2012 Ex MV: No isch/infarct;  b. 04/2012 Echo: EF 55-65%, mildly dil LA.  Marland Kitchen Vertigo     Past Surgical History  Procedure Laterality Date  . Knee surgery  1999    left  . Laparoscopic cholecystectomy  april 2013    Family History  Problem Relation Age of Onset  . Heart disease Son   . Colon cancer Neg Hx   . Stomach cancer Neg Hx     History   Social History  . Marital Status: Married    Spouse Name: N/A    Number of Children: 2  . Years of Education: N/A   Occupational History  . [employer]   .     Social History Main Topics  . Smoking status: Never Smoker   . Smokeless tobacco: Never Used  . Alcohol Use: 1.8 oz/week    3 Cans of beer per week  . Drug Use: No  . Sexual Activity: Not on file   Other Topics Concern  . Not on file   Social History Narrative   The PMH, [place], Social  History, Family History, Medications, and allergies have been reviewed in [facility], and have been updated if relevant.   Review of Systems  Constitutional: Negative.   HENT: Negative for congestion, ear pain, facial swelling, hearing loss, postnasal drip, rhinorrhea, sinus pressure, sneezing, sore throat and voice change.   Eyes: Positive for photophobia and itching. Negative for pain, discharge, redness and visual disturbance.  Respiratory: Negative.   Cardiovascular: Negative.   Gastrointestinal: Negative.   Endocrine: Negative.   Musculoskeletal: Positive for gait problem. Negative for back pain.  Skin: Negative.   Neurological: Negative.   Psychiatric/Behavioral: Negative.   All other systems reviewed and are negative.      Objective:    BP 120/80 mmHg  Pulse 58  Temp(Src) 97.9 F (36.6 C) (Oral)  Wt 196 lb (88.905 kg)  SpO2 97%   Physical Exam    Constitutional: He is oriented to person, place, and time. He appears well-developed and well-nourished. No distress.  HENT:  Head: Normocephalic.  Eyes: EOM are normal. Pupils are equal, round, and reactive to light. Lids are everted and swept, no foreign bodies found. Right eye exhibits no discharge, no exudate and no hordeolum. Left eye exhibits no discharge, no exudate and no hordeolum. Right conjunctiva is injected. Left conjunctiva is injected. No scleral icterus.  Cardiovascular: Normal rate.   Pulmonary/Chest: Effort normal and breath sounds normal. No respiratory distress.  Musculoskeletal:       Left knee: He exhibits decreased range of motion and swelling. He exhibits no LCL laxity, no bony tenderness and normal meniscus.  +crepitus  Neurological: He is alert and oriented to person, place, and time. No cranial nerve deficit.  Skin: Skin is warm and dry.  Psychiatric: He has a normal mood and affect. His behavior is normal. Judgment and thought content normal.  Nursing note and vitals reviewed.         Assessment & Plan:   Left knee pain  Dry eye syndrome, unspecified laterality  Acute allergic conjunctivitis of both eyes No Follow-up on file.

## 2014-08-03 NOTE — Assessment & Plan Note (Signed)
New- progressive. With probable internal derangement/OA and scar tissue. Xray today will like need to be referred back to ortho. The patient indicates understanding of these issues and agrees with the plan.

## 2014-08-03 NOTE — Patient Instructions (Signed)
Nice to see you. Happy New Year.  I will call you with your xray and lab results.  Let me know in the next week or two how the eye drops are working.

## 2014-08-03 NOTE — Assessment & Plan Note (Signed)
New- advised to try to figure out what he may be coming into contact with that may be triggering this reaction. eRx sent for antihistamine eye drops- advised to call with an update in 1-2 weeks. Go to eye doctor immediately for eye pain, visual changes, redness or worsening photophobia. The patient indicates understanding of these issues and agrees with the plan.

## 2014-08-03 NOTE — Assessment & Plan Note (Signed)
Due for labs- will check lipid panel today. The patient indicates understanding of these issues and agrees with the plan.  Orders Placed This Encounter  Procedures  . DG Knee Complete 4 Views Left  . Lipid panel  . Comprehensive metabolic panel

## 2014-08-03 NOTE — Progress Notes (Signed)
Pre visit review using our clinic review tool, if applicable. No additional management support is needed unless otherwise documented below in the visit note. 

## 2014-08-14 ENCOUNTER — Encounter: Payer: Self-pay | Admitting: Family Medicine

## 2014-08-14 ENCOUNTER — Ambulatory Visit (INDEPENDENT_AMBULATORY_CARE_PROVIDER_SITE_OTHER): Payer: BLUE CROSS/BLUE SHIELD | Admitting: Family Medicine

## 2014-08-14 VITALS — BP 100/66 | HR 60 | Temp 97.5°F | Ht 64.0 in | Wt 199.5 lb

## 2014-08-14 DIAGNOSIS — M1712 Unilateral primary osteoarthritis, left knee: Secondary | ICD-10-CM

## 2014-08-14 MED ORDER — MELOXICAM 15 MG PO TABS: 15.0000 mg | ORAL_TABLET | Freq: Every day | ORAL | Status: DC

## 2014-08-14 MED ORDER — TRAMADOL HCL 50 MG PO TABS: 50.0000 mg | ORAL_TABLET | Freq: Four times a day (QID) | ORAL | Status: DC | PRN

## 2014-08-14 NOTE — Progress Notes (Signed)
Pre visit review using our clinic review tool, if applicable. No additional management support is needed unless otherwise documented below in the visit note. 

## 2014-08-14 NOTE — Progress Notes (Signed)
Dr. Frederico Hamman T. Hallee Mckenny, MD, [facility] [place], [address] Phone: [phone] Fax: [phone]  08/14/2014  Patient: Brandon Farrell, MRN: [id number], DOB: Dec 09, 1960, 54 y.o.  Primary Physician:  Arnette Norris, MD  Chief Complaint: Knee Pain  Subjective:   Brandon Farrell is a 54 y.o. very pleasant male patient who presents with the following:  Consult: Dr. Deborra Medina Left knee pain Spanish interpreter.  20 years ago had a surgery, has always bothered him since that time. Now cannot exercise. When walks feels like gets out of place. It is often.  When plays sports it will swell. After that, he will have pain for approximately 8 days. He has not tried any medications or any paresis. He has never had any injections. Has never done any physical therapy. He has noted that his left knee has had some wasting around the quadriceps compared to the right side.  Works at [employer]. Tired and starts bothering him.   By history, his surgery sounds as if he had a partial meniscectomy or a meniscectomy on the left. Menisctomy.   Past Medical History, Surgical History, Social History, Family History, Problem List, Medications, and Allergies have been reviewed and updated if relevant.  GEN: No fevers, chills. Nontoxic. Primarily MSK c/o today. MSK: Detailed in the HPI GI: tolerating PO intake without difficulty Neuro: No numbness, parasthesias, or tingling associated. Otherwise, the pertinent positives and negatives are listed above and in the HPI, otherwise a full review of systems has been reviewed and is negative unless noted positive.   Objective:   BP 100/66 mmHg  Pulse 60  Temp(Src) 97.5 F (36.4 C) (Oral)  Ht 5\' 4"  (1.626 m)  Wt 199 lb 8 oz (90.493 kg)  BMI 34.23 kg/m2   GEN: WDWN, NAD, Non-toxic, Alert & Oriented x 3 HEENT: Atraumatic, Normocephalic.  Ears and Nose: No external deformity. EXTR: No  clubbing/cyanosis/edema NEURO: Normal gait.  PSYCH: Normally interactive. Conversant. Not depressed or anxious appearing.  Calm demeanor.    Left knee: The patient lacks 3 of terminal extension. Flexion to 125. Notable crepitus and loss of patellar motion. No significant joint line tenderness. No effusion. Bounce home test is positive. Flexion pinch test is negative. McMurray's test is negative. Stable anterior cruciate ligament, PCL, MCL, and LCL. Notable L quad wasting.  Radiology: Dg Knee Complete 4 Views Left  08/03/2014   CLINICAL DATA:  Left knee pain  EXAM: LEFT KNEE - COMPLETE 4+ VIEW  COMPARISON:  None  FINDINGS: Degenerative changes are noted most prominent in the lateral joint space. No significant joint effusion is seen. No fracture or dislocation is noted. Mild patellofemoral degenerative change is noted as well.  IMPRESSION: Degenerative change without acute abnormality.   Electronically Signed   By: Inez Catalina M.D.   On: 08/03/2014 13:44    Assessment and Plan:   Primary osteoarthritis of left knee  >40 minutes spent in face to face time with patient, >50% spent in counselling or coordination of care:   Mild to moderate osteoarthritis, greater in the lateral and patellofemoral compartments. Preceding partial meniscectomy versus meniscectomy done distantly. Notable quadriceps wasting on the left. Recommend strengthening her quadriceps, knee rehabilitation for arthritis. Additional time spent communication through [place] interpreter in trying to find [place] handouts for the patient.  Mobic for the next 3 weeks, then intermittent use as needed, tramadol when necessary.  New Prescriptions   MELOXICAM (MOBIC) 15 MG TABLET  Take 1 tablet (15 mg total) by mouth daily.   TRAMADOL (ULTRAM) 50 MG TABLET    Take 1 tablet (50 mg total) by mouth every 6 (six) hours as needed.   No orders of the defined types were placed in this encounter.    Signed,  Maud Deed. Pamlea Finder,  MD   Patient's Medications  New Prescriptions   MELOXICAM (MOBIC) 15 MG TABLET    Take 1 tablet (15 mg total) by mouth daily.   TRAMADOL (ULTRAM) 50 MG TABLET    Take 1 tablet (50 mg total) by mouth every 6 (six) hours as needed.  Previous Medications   CHOLINE FENOFIBRATE (FENOFIBRIC ACID) 135 MG CPDR    TAKE 1 CAPSULE EVERY DAY  Modified Medications   No medications on file  Discontinued Medications   AZELASTINE (OPTIVAR) 0.05 % OPHTHALMIC SOLUTION    Place 1 drop into both eyes 2 (two) times daily.

## 2014-10-02 ENCOUNTER — Encounter: Payer: Self-pay | Admitting: Cardiovascular Disease

## 2014-10-02 ENCOUNTER — Ambulatory Visit (INDEPENDENT_AMBULATORY_CARE_PROVIDER_SITE_OTHER): Payer: BLUE CROSS/BLUE SHIELD | Admitting: Cardiovascular Disease

## 2014-10-02 VITALS — BP 112/76 | HR 62 | Ht 64.0 in | Wt 199.1 lb

## 2014-10-02 DIAGNOSIS — E785 Hyperlipidemia, unspecified: Secondary | ICD-10-CM | POA: Insufficient documentation

## 2014-10-02 MED ORDER — ATORVASTATIN CALCIUM 20 MG PO TABS: 20.0000 mg | ORAL_TABLET | Freq: Every day | ORAL | Status: DC

## 2014-10-02 NOTE — Patient Instructions (Signed)
Please start atorvastatin 20 mg once daily  Your physician recommends that you return for lab work in: 3 months  Your physician wants you to follow-up in: 6 months  You will receive a reminder letter in the mail two months in advance.  If you don't receive a letter, please call our office to schedule the follow-up appointment.

## 2014-10-02 NOTE — Progress Notes (Signed)
Cardiology Office Note   Date:  10/02/2014   ID:  Brandon, Farrell [birth year], MRN [id number]  PCP:  Brandon Norris, MD  Cardiologist:   Brandon Headings, MD   Chief Complaint  Patient presents with  . Follow-up    hyperlipidemia   1. Hypertriglyceridemia  History of Present Illness:  Brandon Farrell is a 54 yo hispanic gentleman with a hx of hyperlipidemia. He was accompanied by a friend who serves as Brandon Farrell. His son died 40 in his sleep at age 35. He's here today for evaluation and risk reduction. He's not had any cardiac complaints.  [name] remains quite active. He plays soccer on regular basis. He's never had episodes of chest pain or shortness breath. He owns [employer]. He has a history of hyperlipidemia and his lipids have been fairly well controlled.  Dec. 4, 2013: Following his initial consultation in August we performed an echocardiogram and Myoview study. The echo revealed normal left ventricular systolic function. The Myoview revealed no evidence of ischemia and also revealed normal left ventricular systolic function.  He hs dizziness for the past 2 weeks when he goes from a lying position to a sitting position or standing position.   Oct. 1, 2014:  Brandon Farrell presents with some episodes of dizziness. The symptoms were were worse when he would lie back. He has not been playing soccer because of a back issue. He has had a normal echo and a normal myoview study. He has not had any chest pain or dyspnea.   March 22, 2014:  Brandon Farrell is seen today for followup visit. He was seen by Brandon Bayley, PA for some palpitations. He placed to monitor on him. So far, there have not been any significant arrhythmias   October 02, 2014:   Brandon Farrell is a 54 y.o. male who presents for follow up of his syncope. Seen with Brandon Farrell. No further episode of syncope  Not as active this past winter but plays soccor on a regular basis.      Past  Medical History  Diagnosis Date  . Hypertension   . Hypercholesterolemia   . Cholelithiasis   . Palpitations     a. 03/2012 Ex MV: No isch/infarct;  b. 04/2012 Echo: EF 55-65%, mildly dil LA.  Marland Kitchen Vertigo     Past Surgical History  Procedure Laterality Date  . Knee surgery  1999    left  . Laparoscopic cholecystectomy  april 2013     Current Outpatient Prescriptions  Medication Sig Dispense Refill  . Choline Fenofibrate (FENOFIBRIC ACID) 135 MG CPDR TAKE 1 CAPSULE EVERY DAY 30 capsule 3   No current facility-administered medications for this visit.    Allergies:   Review of patient's allergies indicates no known allergies.    Social History:  The patient  reports that he has never smoked. He has never used smokeless tobacco. He reports that he drinks about 1.8 oz of alcohol per week. He reports that he does not use illicit drugs.   Family History:  The patient's family history includes Heart disease in his son. There is no history of Colon cancer or Stomach cancer.    ROS:  Please see the history of present illness.    Review of Systems: Constitutional:  denies fever, chills, diaphoresis, appetite change and fatigue.  HEENT: denies photophobia, eye pain, redness, hearing loss, ear pain, congestion, sore throat, rhinorrhea, sneezing, neck pain, neck stiffness and tinnitus.  Respiratory: denies SOB, DOE, cough, chest tightness, and  wheezing.  Cardiovascular: denies chest pain, palpitations and leg swelling.  Gastrointestinal: denies nausea, vomiting, abdominal pain, diarrhea, constipation, blood in stool.  Genitourinary: denies dysuria, urgency, frequency, hematuria, flank pain and difficulty urinating.  Musculoskeletal: denies  myalgias, back pain, joint swelling, arthralgias and gait problem.   Skin: denies pallor, rash and wound.  Neurological: denies dizziness, seizures, syncope, weakness, light-headedness, numbness and headaches.   Hematological: denies adenopathy, easy  bruising, personal or family bleeding history.  Psychiatric/ Behavioral: denies suicidal ideation, mood changes, confusion, nervousness, sleep disturbance and agitation.       All other systems are reviewed and negative.    PHYSICAL EXAM: VS:  BP 112/76 mmHg  Pulse 62  Ht 5\' 4"  (1.626 m)  Wt 199 lb 1.9 oz (90.32 kg)  BMI 34.16 kg/m2 , BMI Body mass index is 34.16 kg/(m^2). GEN: Well nourished, well developed, in no acute distress HEENT: normal Neck: no JVD, carotid bruits, or masses Cardiac: RRR; no murmurs, rubs, or gallops,no edema  Respiratory:  clear to auscultation bilaterally, normal work of breathing GI: soft, nontender, nondistended, + BS MS: no deformity or atrophy Skin: warm and dry, no rash Neuro:  Strength and sensation are intact Psych: normal   EKG:  EKG is not ordered today.    Recent Labs: 03/10/2014: Magnesium 2.2; TSH 2.960 08/03/2014: ALT 24; BUN 18; Creatinine 1.2; Potassium 4.5; Sodium 140    Lipid Panel    Component Value Date/Time   CHOL 189 08/03/2014 0922   TRIG 119.0 08/03/2014 0922   HDL 31.70* 08/03/2014 0922   CHOLHDL 6 08/03/2014 0922   VLDL 23.8 08/03/2014 0922   LDLCALC 134* 08/03/2014 0922   LDLDIRECT 124.7 03/27/2011 1225      Wt Readings from Last 3 Encounters:  10/02/14 199 lb 1.9 oz (90.32 kg)  08/14/14 199 lb 8 oz (90.493 kg)  08/03/14 196 lb (88.905 kg)      Other studies Reviewed: Additional studies/ records that were reviewed today include: lipid levels from Dr.Aron's visit. Review of the above records demonstrates: persistently elevated chol levels.   ASSESSMENT AND PLAN:  1.  Hyperlipidemia: Castiel is doing very well. Is not having any episodes of chest pain or shortness breath. His last lipid levels revealed a persistently elevated cholesterol level. His triglyceride levels are well controlled. We will add atorvastatin 20 g a day. He will continue taking fenofibric. I'll check fasting labs in 3 months and I will  see him again in 6 months for follow-up visit.  Current medicines are reviewed at length with the patient today.  The patient does not have concerns regarding medicines.  The following changes have been made:  See above   Disposition:   FU with me in 6 months .    Signed, Kathey Simer, Wonda Cheng, MD  10/02/2014 11:42 AM    [facility] [place], [place], [place]  [address] Phone: [phone]; Fax: [phone]

## 2014-11-19 NOTE — Op Note (Signed)
PATIENT NAME:  Brandon Farrell, Brandon Farrell MR#:  [id number] DATE OF BIRTH:  [birth year]  DATE OF PROCEDURE:  12/03/2011  PREOPERATIVE DIAGNOSIS: Symptomatic cholelithiasis.   POSTOPERATIVE DIAGNOSIS: Symptomatic cholelithiasis.   PROCEDURE: Laparoscopic cholecystectomy.   SURGEON: Phoebe Perch, MD  ANESTHESIA: General with endotracheal tube.   INDICATIONS: This is a patient with recurrent episodes of right upper quadrant pain associated with fatty food intolerance and work-up showing gallstones. Preoperatively we discussed the rationale for surgery, the options of observation, risks of bleeding, infection, recurrence of symptoms, failure to resolve his symptoms, open procedure, bile duct damage, bile duct leak, and retained common bile duct stone any of which could require further surgery and/or ERCP, stent, and papillotomy. This was reviewed for him in the preop holding area. He understood and agreed to proceed.   FINDINGS: Fine sludge and gallstones.   DESCRIPTION OF PROCEDURE: The patient was induced to general anesthesia, given IV antibiotics, prepped and draped in a sterile fashion. Marcaine was infiltrated in the skin and subcutaneous tissues around the periumbilical area. An incision was made. A Veress needle was placed. Pneumoperitoneum was obtained and a 5 mm trocar port was placed. The abdominal cavity was explored and under direct vision a 10 mm epigastric port and two lateral 5 mm ports were placed. The gallbladder was placed on tension. The peritoneum over the infundibulum was incised bluntly. The cystic duct gallbladder junction was well identified and utilizing the applied medical clip it was doubly clipped and divided. The cystic artery was doubly clipped and divided in two branches and then the gallbladder was taken from the gallbladder fossa with electrocautery and passed out through the epigastric port site with the aid of an Endo Catch bag. The area was checked for hemostasis and found to be  adequate. There was no sign of bleeding, bile leak, or bowel injury. The camera was placed in the epigastric site to view back to the periumbilical site. Again, no sign of bowel injury or adhesions. Once assuring that hemostasis was adequate and there was no sign of bile leak, the pneumoperitoneum was released, all ports were removed, fascial edges at the epigastric site were approximated with 0 Vicryl figure-of-eight sutures, and then 4-0 subcuticular Monocryl was used on all skin edges. Steri-Strips, Mastisol, and sterile dressings were placed.   The patient tolerated the procedure well. There were no complications. He was taken to the recovery room in stable condition to be discharged in the care of service family. He will followup in 10 days. ____________________________ Jerrol Banana Burt Knack, MD rec:slb D: 12/03/2011 12:37:01 ET T: 12/03/2011 13:00:32 ET JOB#: [id number]  cc: Jerrol Banana. Burt Knack, MD, <Dictator> Florene Glen MD ELECTRONICALLY SIGNED 12/06/2011 19:23

## 2014-11-19 NOTE — H&P (Signed)
PATIENT NAME:  Brandon Farrell, Brandon Farrell MR#:  [id number] DATE OF BIRTH:  May 14, 1961  DATE OF ADMISSION:  12/03/2011  CHIEF COMPLAINT: Right upper quadrant pain.   HISTORY OF PRESENT ILLNESS: This is a patient with recurrent episodes of right upper quadrant pain associated with fatty food intolerance and work-up showing gallstones. He has been to the emergency room twice and was seen in the office several months ago at which time surgery was discussed with him, but he had a trip to [place] and decided to forego surgery. He has recurrent abdominal pain associated with fatty food intolerance, nausea, and occasional vomiting. No fevers or chills and no jaundice or acholic stools. He is here for elective laparoscopic cholecystectomy.   PAST MEDICAL HISTORY:  1. Hypercholesterolemia.  2. Hypertension.  3. Cholelithiasis.   PAST SURGICAL HISTORY: Knee surgery.   MEDICATIONS: None.   ALLERGIES: No known drug allergies.   FAMILY HISTORY: Noncontributory.   SOCIAL HISTORY: The patient does not smoke, rarely drinks alcohol.  REVIEW OF SYSTEMS: Ten system review has been performed and negative and it is documented in the office chart.   PHYSICAL EXAMINATION:   GENERAL: Healthy, Hispanic male patient.   HEENT: No scleral icterus.   NECK: No palpable neck nodes.   CHEST: Clear to auscultation.   CARDIAC: Regular rate and rhythm.   ABDOMEN: Soft and nontender.   EXTREMITIES: No edema. Calves are nontender.   NEUROLOGIC: Grossly intact.   INTEGUMENT: No jaundice.   LABS/STUDIES: Laboratory values show no choledocholithiasis signs with normal LFTs.   Ultrasound shows stones.   ASSESSMENT AND PLAN: This is a patient with symptomatic cholelithiasis. I have recommended elective laparoscopic cholecystectomy for control of his symptoms. I discussed with him the rationale for offering surgery and the potential for doing this when he is under acute cholecystitis would result in higher risk to the  procedures. The  rationale for offering surgery at this time has been reviewed and the risks of bleeding, infection, open procedure, bile duct damage, bile duct leak, and retained common bile duct stone any of which could require further surgery and/or ERCP, stent, and papillotomy have all been discussed with him with the aid of an interpreter. He understood and agreed to proceed. ____________________________ Jerrol Banana Burt Knack, MD rec:slb D: 11/26/2011 13:51:00 ET      T: 11/26/2011 14:33:59 ET        JOB#: [id number]  cc: Jerrol Banana. Burt Knack, MD, <Dictator> Florene Glen MD ELECTRONICALLY SIGNED 11/27/2011 14:24

## 2014-12-03 ENCOUNTER — Other Ambulatory Visit: Payer: Self-pay | Admitting: Family Medicine

## 2015-01-02 ENCOUNTER — Other Ambulatory Visit (INDEPENDENT_AMBULATORY_CARE_PROVIDER_SITE_OTHER): Payer: BLUE CROSS/BLUE SHIELD | Admitting: *Deleted

## 2015-01-02 DIAGNOSIS — E785 Hyperlipidemia, unspecified: Secondary | ICD-10-CM

## 2015-01-03 LAB — LIPID PANEL
CHOL/HDL RATIO: 3.7 ratio (ref 0.0–5.0)
Cholesterol, Total: 127 mg/dL (ref 100–199)
HDL: 34 mg/dL — ABNORMAL LOW (ref >39)
LDL Calculated: 74 mg/dL (ref 0–99)
Triglycerides: 97 mg/dL (ref 0–149)
VLDL Cholesterol Cal: 19 mg/dL (ref 5–40)

## 2015-01-03 LAB — BASIC METABOLIC PANEL
BUN/Creatinine Ratio: 16 (ref 9–20)
BUN: 19 mg/dL (ref 6–24)
CO2: 22 mmol/L (ref 18–29)
Calcium: 9.3 mg/dL (ref 8.7–10.2)
Chloride: 108 mmol/L (ref 97–108)
Creatinine, Ser: 1.22 mg/dL (ref 0.76–1.27)
GFR calc Af Amer: 77 mL/min/{1.73_m2} (ref >59)
GFR calc non Af Amer: 66 mL/min/{1.73_m2} (ref >59)
Glucose: 103 mg/dL — ABNORMAL HIGH (ref 65–99)
Potassium: 4.4 mmol/L (ref 3.5–5.2)
Sodium: 143 mmol/L (ref 134–144)

## 2015-01-03 LAB — HEPATIC FUNCTION PANEL
ALT: 32 IU/L (ref 0–44)
AST: 26 IU/L (ref 0–40)
Albumin: 4.4 g/dL (ref 3.5–5.5)
Alkaline Phosphatase: 48 IU/L (ref 39–117)
Bilirubin Total: 0.6 mg/dL (ref 0.0–1.2)
Bilirubin, Direct: 0.18 mg/dL (ref 0.00–0.40)
TOTAL PROTEIN: 6.5 g/dL (ref 6.0–8.5)

## 2015-02-09 ENCOUNTER — Ambulatory Visit (INDEPENDENT_AMBULATORY_CARE_PROVIDER_SITE_OTHER): Payer: BLUE CROSS/BLUE SHIELD | Admitting: Internal Medicine

## 2015-02-09 ENCOUNTER — Encounter: Payer: Self-pay | Admitting: Internal Medicine

## 2015-02-09 VITALS — BP 120/78 | HR 66 | Temp 98.2°F | Wt 198.0 lb

## 2015-02-09 DIAGNOSIS — B029 Zoster without complications: Secondary | ICD-10-CM

## 2015-02-09 MED ORDER — VALACYCLOVIR HCL 1 G PO TABS: 1000.0000 mg | ORAL_TABLET | Freq: Three times a day (TID) | ORAL | Status: DC

## 2015-02-09 NOTE — Patient Instructions (Addendum)
Culebrilla (Shingles) La culebrilla (herpes zoster) es una infeccin causada por el mismo virus que causa la varicela. La infeccin causa una erupcin y ampollas dolorosas llenas de lquido en la piel, las cuales se abren, forman [place] y [place] se curan. Puede ocurrir en cualquier parte del cuerpo, pero por lo general afecta solo un lado del cuerpo o del rostro. El dolor de la culebrilla suele durar alrededor de 1 mes. Sin embargo, Psychologist, clinical con culebrilla pueden sufrir dolor de larga duracin (crnico) en la zona del cuerpo afectada. La culebrilla suele ocurrir muchos aos despus de que la persona tuvo varicela. Es ms frecuente:  En Special educational needs teacher de 50 aos.  En los que tienen el sistema inmunolgico debilitado, como en los enfermos con VIH, sida o cncer.  En pacientes que toman medicamentos que debilitan el sistema inmunolgico, como los medicamentos que se indican en caso de transplantes.  En personas sometidas a un gran estrs. CAUSAS  La causa de la culebrilla es el virus varicela zoster (VZV), que tambin causa la varicela. Despus de que una persona se infecta con el virus, Agricultural consultant en su cuerpo durante aos en un estado inactivo (latente). Para causar la culebrilla, el virus se reactiva e irrumpe como una infeccin en una raz nerviosa. El virus se puede transmitir de persona a Nurse, learning disability (es Theatre stage manager) a Designer, jewellery del Diplomatic Services operational officer con las ampollas abiertas de la erupcin que causa la culebrilla. Solo se contagiar a las personas que no han padecido varicela. Cuando estas personas tienen exposicin al virus, pueden sufrir varicela. No van a desarrollar culebrilla. Una vez que las ampollas cicatrizan, la persona ya no [place] y no puede transmitir el virus a Producer, television/film/video. SIGNOS Y SNTOMAS  La culebrilla aparece en etapas. Los sntomas iniciales pueden ser dolor, picazn y sensacin de hormigueo en un rea de la piel. Este dolor se describe generalmente como ardor,  punzante o pulstil. En unos [place] o semanas aparece una erupcin roja dolorosa en el rea donde se sinti dolor, picazn y hormigueo. La erupcin generalmente aparece en un lado del cuerpo en un patrn de bandas o semejante a una correa. Luego la erupcin por lo general se convierte en un grupo de ampollas llenas de lquido. Estas ampollas forman Serita Grammes y se secan en aproximadamente 2 a 3 semanas. Con los sntomas iniciales, la erupcin o las ampollas tambin puede haber sntomas similares a la gripe. Estos pueden incluir:  Cristy Hilts.  Escalofros.  Dolor de [place].  Malestar estomacal. DIAGNSTICO  El mdico le har un examen de la piel para diagnosticar la [place]. Tambin podrn tomarle muestras de piel o de fluido de las ampollas. Esta muestra se examina bajo el microscopio o se enva al laboratorio para su anlisis posterior. TRATAMIENTO  No existe un tratamiento especfico para la culebrilla. El mdico probablemente le recete medicamentos para ayudarlo a Financial controller, a recuperarse ms rpido y a Customer service manager a Barrister's clerk. Puede incluir medicamentos antivirales, anti-inflamatorios y analgsicos. Turner un bao de agua fra o aplique compresas fras en el rea de la erupcin o ampollas segn lo indicado. Esto disminuir el dolor y Cabin crew.  Tome los medicamentos solamente como se lo haya indicado el mdico.  Haga reposo segn las indicaciones del mdico.  Mantenga la erupcin y las ampollas limpias con jabn suave y agua fresca o como se lo indique el mdico.  No pellizque las ampollas ni se rasque en  la zona de la erupcin. Aplique una crema para calmar la picazn o cremas anestsicas en el rea afectada segn las indicaciones del mdico.  Mantenga la erupcin cubierta con un vendaje suelto (apsito).  Evite el contacto con:  Bebs.  Embarazadas.  Nios con eczema.  Personas mayores que han recibido un  transplante.  Personas con enfermedades crnicas, como leucemia y sida.  Use ropa holgada para ayudar a Best boy del roce con la erupcin.  Concurra a todas las visitas de control como se lo haya indicado el mdico. Si la zona afectada est en el rostro, podrn derivarlo a un especialista, como un mdico especialista en ojos (oftalmlogo) o en odos, nariz y Investment banker, operational (otorrinolaringlogo). Concurrir a todas las visitas de control lo ayudar a Publishing rights manager en los ojos, dolor crnico y discapacidad. SOLICITE ATENCIN MDICA DE INMEDIATO SI:   Tiene dolor en el rostro, en la zona de los ojos o tiene prdida de sensibilidad en un lado del rostro.  Siente dolor o zumbido en el odo.  Tiene prdida del gusto.  El dolor no se alivia con los medicamentos prescritos.  La irritacin o la hinchazn se extienden.  Tiene ms dolor e inflamacin.  La afeccin empeora o ha [place].  Tiene fiebre. ASEGRESE DE QUE:  Comprende estas instrucciones.  Controlar su afeccin.  Recibir ayuda de inmediato si no mejora o si empeora. Document Released: 04/23/2005 Document Revised: 11/28/2013 [facility] Patient Information 2015 [place]. This information is not intended to replace advice given to you by your health care provider. Make sure you discuss any questions you have with your health care provider.

## 2015-02-09 NOTE — Progress Notes (Signed)
Subjective:    Patient ID: Brandon Farrell, male    DOB: Jan 07, 1961, 54 y.o.   MRN: [id number]  HPI  Pt presents to the clinic today with c/o a rash on his right chest that extends to his right upper back. He noticed this 8 days ago. He reports the rash looks like blisters. It is itchy and painful. He is unsure if he had chicken pox as a child. He has not tried anything OTC.  Review of Systems      Past Medical History  Diagnosis Date  . Hypertension   . Hypercholesterolemia   . Cholelithiasis   . Palpitations     a. 03/2012 Ex MV: No isch/infarct;  b. 04/2012 Echo: EF 55-65%, mildly dil LA.  Marland Kitchen Vertigo     Current Outpatient Prescriptions  Medication Sig Dispense Refill  . atorvastatin (LIPITOR) 20 MG tablet Take 1 tablet (20 mg total) by mouth daily. 90 tablet 3  . Choline Fenofibrate (FENOFIBRIC ACID) 135 MG CPDR TAKE 1 CAPSULE EVERY DAY 30 capsule 3  . valACYclovir (VALTREX) 1000 MG tablet Take 1 tablet (1,000 mg total) by mouth 3 (three) times daily. 30 tablet 0   No current facility-administered medications for this visit.    No Known Allergies  Family History  Problem Relation Age of Onset  . Heart disease Son   . Colon cancer Neg Hx   . Stomach cancer Neg Hx     History   Social History  . Marital Status: Married    Spouse Name: N/A  . Number of Children: 2  . Years of Education: N/A   Occupational History  . [employer]   .     Social History Main Topics  . Smoking status: Never Smoker   . Smokeless tobacco: Never Used  . Alcohol Use: 1.8 oz/week    3 Cans of beer per week     Comment: occasional  . Drug Use: No  . Sexual Activity: Not on file   Other Topics Concern  . Not on file   Social History Narrative     Constitutional: Denies fever, malaise, fatigue, headache or abrupt weight changes.  Respiratory: Denies difficulty breathing, shortness of breath, cough or sputum production.   Cardiovascular: Denies chest pain, chest tightness,  palpitations or swelling in the hands or feet.  Skin: Pt reports rash. Denies  ulcercations.     No other specific complaints in a complete review of systems (except as listed in HPI above).  Objective:   Physical Exam  BP 120/78 mmHg  Pulse 66  Temp(Src) 98.2 F (36.8 C) (Oral)  Wt 198 lb (89.812 kg)  SpO2 98% Wt Readings from Last 3 Encounters:  02/09/15 198 lb (89.812 kg)  10/02/14 199 lb 1.9 oz (90.32 kg)  08/14/14 199 lb 8 oz (90.493 kg)    General: Appears his stated age, well developed, well nourished in NAD. Skin: Vesicular lesions on an erythematous base in dermatome pattern starting on right upper chest and extending to right upper back. Rash does not cross midline. Cardiovascular: Normal rate and rhythm. S1,S2 noted.  No murmur, rubs or gallops noted.  Pulmonary/Chest: Normal effort and positive vesicular breath sounds. No respiratory distress. No wheezes, rales or ronchi noted.    BMET    Component Value Date/Time   NA 143 01/02/2015 0820   NA 140 08/03/2014 0922   NA 141 11/28/2011 0141   K 4.4 01/02/2015 0820   K 3.4* 11/28/2011 0141   CL  108 01/02/2015 0820   CL 109* 11/28/2011 0141   CO2 22 01/02/2015 0820   CO2 25 11/28/2011 0141   GLUCOSE 103* 01/02/2015 0820   GLUCOSE 100* 08/03/2014 0922   GLUCOSE 116* 11/28/2011 0141   BUN 19 01/02/2015 0820   BUN 18 08/03/2014 0922   BUN 17 11/28/2011 0141   CREATININE 1.22 01/02/2015 0820   CREATININE 1.22 11/28/2011 0141   CALCIUM 9.3 01/02/2015 0820   CALCIUM 9.1 11/28/2011 0141   GFRNONAA 66 01/02/2015 0820   GFRNONAA >60 11/28/2011 0141   GFRAA 77 01/02/2015 0820   GFRAA >60 11/28/2011 0141    Lipid Panel     Component Value Date/Time   CHOL 127 01/02/2015 0820   CHOL 189 08/03/2014 0922   TRIG 97 01/02/2015 0820   HDL 34* 01/02/2015 0820   HDL 31.70* 08/03/2014 0922   CHOLHDL 3.7 01/02/2015 0820   CHOLHDL 6 08/03/2014 0922   VLDL 23.8 08/03/2014 0922   LDLCALC 74 01/02/2015 0820   LDLCALC  134* 08/03/2014 0922    CBC    Component Value Date/Time   WBC 7.9 11/28/2011 0141   WBC 6.0 06/14/2007 0947   RBC 5.03 11/28/2011 0141   RBC 5.15 06/14/2007 0947   HGB 15.4 11/28/2011 0141   HGB 16.3 06/14/2007 0947   HCT 45.4 11/28/2011 0141   HCT 45.9 06/14/2007 0947   PLT 197 11/28/2011 0141   PLT 192 06/14/2007 0947   MCV 90 11/28/2011 0141   MCV 89.1 06/14/2007 0947   MCH 30.7 11/28/2011 0141   MCHC 34.0 11/28/2011 0141   MCHC 35.6 06/14/2007 0947   RDW 13.2 11/28/2011 0141   RDW 12.2 06/14/2007 0947   LYMPHSABS 2.8 11/28/2011 0141   MONOABS 0.6 11/28/2011 0141   MONOABS 0.4 06/14/2007 0947   EOSABS 0.2 11/28/2011 0141   EOSABS 0.1 06/14/2007 0947   BASOSABS 0.0 11/28/2011 0141   BASOSABS 0.0 06/14/2007 0947    Hgb A1C Lab Results  Component Value Date   HGBA1C 5.9 03/27/2011         Assessment & Plan:   Shingles:  eRx for Valtrex TID x 10 days OK to use Calamine lotion if you want to (this was his request) Avoid contact with cancer pt, transplant pt or newborns You should contact your insurance company to see where they will cover you for the shingles vaccine and call back to let your PCP know  RTC as needed or if symptoms persist or worsen

## 2015-02-09 NOTE — Progress Notes (Signed)
Pre visit review using our clinic review tool, if applicable. No additional management support is needed unless otherwise documented below in the visit note. 

## 2015-02-12 ENCOUNTER — Ambulatory Visit: Payer: BLUE CROSS/BLUE SHIELD | Admitting: Family Medicine

## 2015-02-20 ENCOUNTER — Ambulatory Visit: Payer: BLUE CROSS/BLUE SHIELD | Admitting: Family Medicine

## 2015-04-04 ENCOUNTER — Other Ambulatory Visit: Payer: Self-pay | Admitting: Family Medicine

## 2015-04-25 ENCOUNTER — Ambulatory Visit: Payer: BLUE CROSS/BLUE SHIELD | Admitting: Cardiovascular Disease

## 2015-04-25 ENCOUNTER — Ambulatory Visit (INDEPENDENT_AMBULATORY_CARE_PROVIDER_SITE_OTHER): Payer: BLUE CROSS/BLUE SHIELD | Admitting: Cardiovascular Disease

## 2015-04-25 ENCOUNTER — Encounter: Payer: Self-pay | Admitting: Cardiovascular Disease

## 2015-04-25 VITALS — BP 100/64 | HR 62 | Ht 66.0 in | Wt 196.5 lb

## 2015-04-25 DIAGNOSIS — E785 Hyperlipidemia, unspecified: Secondary | ICD-10-CM | POA: Diagnosis not present

## 2015-04-25 NOTE — Progress Notes (Signed)
Cardiology Office Note   Date:  04/25/2015   ID:  Brandon Farrell, Brandon Farrell 1960/09/16, MRN [id number]  PCP:  Brandon Norris, MD  Cardiologist:   Brandon Headings, MD   Chief Complaint  Patient presents with  . other    hyperlipidemia,    1. Hypertriglyceridemia  History of Present Illness:  Brandon Farrell is a 54 yo hispanic gentleman with a hx of hyperlipidemia. He was accompanied by a friend who serves as Brandon Farrell. His son died 38 in his sleep at age 37. He's here today for evaluation and risk reduction. He's not had any cardiac complaints.  Brandon Farrell remains quite active. He plays soccer on regular basis. He's never had episodes of chest pain or shortness breath. He owns [employer]. He has a history of hyperlipidemia and his lipids have been fairly well controlled.  Dec. 4, 2013: Following his initial consultation in August we performed an echocardiogram and Myoview study. The echo revealed normal left ventricular systolic function. The Myoview revealed no evidence of ischemia and also revealed normal left ventricular systolic function.  He hs dizziness for the past 2 weeks when he goes from a lying position to a sitting position or standing position.   Oct. 1, 2014:  Brandon Farrell presents with some episodes of dizziness. The symptoms were were worse when he would lie back. He has not been playing soccer because of a back issue. He has had a normal echo and a normal myoview study. He has not had any chest pain or dyspnea.   March 22, 2014:  Brandon Farrell is seen today for followup visit. He was seen by Brandon Bayley, PA for some palpitations. He placed to monitor on him. So far, there have not been any significant arrhythmias   October 02, 2014:   Brandon Farrell is a 54 y.o. male who presents for follow up of his syncope. Seen with Brandon Farrell. No further episode of syncope  Not as active this past winter but plays [employer] on a regular basis.    04/25/2015:  Pt is seen with Brandon Farrell, Intrepreter.  Doing ok. Limited by orthopedic injuries , not playing soccer . No CP or dyspnea Was started on Lipitor in March .  Labs in June looked great.     Past Medical History  Diagnosis Date  . Hypertension   . Hypercholesterolemia   . Cholelithiasis   . Palpitations     a. 03/2012 Ex MV: No isch/infarct;  b. 04/2012 Echo: EF 55-65%, mildly dil LA.  Marland Kitchen Vertigo     Past Surgical History  Procedure Laterality Date  . Knee surgery  1999    left  . Laparoscopic cholecystectomy  april 2013     Current Outpatient Prescriptions  Medication Sig Dispense Refill  . atorvastatin (LIPITOR) 20 MG tablet Take 1 tablet (20 mg total) by mouth daily. 90 tablet 3  . Choline Fenofibrate (FENOFIBRIC ACID) 135 MG CPDR TAKE 1 CAPSULE EVERY DAY 30 capsule 3  . valACYclovir (VALTREX) 1000 MG tablet Take 1 tablet (1,000 mg total) by mouth 3 (three) times daily. (Patient not taking: Reported on 04/25/2015) 30 tablet 0   No current facility-administered medications for this visit.    Allergies:   Review of patient's allergies indicates no known allergies.    Social History:  The patient  reports that he has never smoked. He has never used smokeless tobacco. He reports that he drinks about 1.8 oz of alcohol per week. He reports that he does not  use illicit drugs.   Family History:  The patient's family history includes Heart disease in his son. There is no history of Colon cancer or Stomach cancer.    ROS:  Please see the history of present illness.    Review of Systems: Constitutional:  denies fever, chills, diaphoresis, appetite change and fatigue.  HEENT: denies photophobia, eye pain, redness, hearing loss, ear pain, congestion, sore throat, rhinorrhea, sneezing, neck pain, neck stiffness and tinnitus.  Respiratory: denies SOB, DOE, cough, chest tightness, and wheezing.  Cardiovascular: denies chest pain, palpitations and leg swelling.   Gastrointestinal: denies nausea, vomiting, abdominal pain, diarrhea, constipation, blood in stool.  Genitourinary: denies dysuria, urgency, frequency, hematuria, flank pain and difficulty urinating.  Musculoskeletal: denies  myalgias, back pain, joint swelling, arthralgias and gait problem.   Skin: denies pallor, rash and wound.  Neurological: denies dizziness, seizures, syncope, weakness, light-headedness, numbness and headaches.   Hematological: denies adenopathy, easy bruising, personal or family bleeding history.  Psychiatric/ Behavioral: denies suicidal ideation, mood changes, confusion, nervousness, sleep disturbance and agitation.       All other systems are reviewed and negative.    PHYSICAL EXAM: VS:  BP 100/64 mmHg  Pulse 62  Ht 5\' 6"  (1.676 m)  Wt 89.132 kg (196 lb 8 oz)  BMI 31.73 kg/m2 , BMI Body mass index is 31.73 kg/(m^2). GEN: Well nourished, well developed, in no acute distress HEENT: normal Neck: no JVD, carotid bruits, or masses Cardiac: RRR; no murmurs, rubs, or gallops,no edema  Respiratory:  clear to auscultation bilaterally, normal work of breathing GI: soft, nontender, nondistended, + BS MS: no deformity or atrophy Skin: warm and dry, no rash Neuro:  Strength and sensation are intact Psych: normal  EKG:  EKG is not ordered today.  Recent Labs: 01/02/2015: ALT 32; BUN 19; Creatinine, Ser 1.22; Potassium 4.4; Sodium 143    Lipid Panel    Component Value Date/Time   CHOL 127 01/02/2015 0820   CHOL 189 08/03/2014 0922   TRIG 97 01/02/2015 0820   HDL 34* 01/02/2015 0820   HDL 31.70* 08/03/2014 0922   CHOLHDL 3.7 01/02/2015 0820   CHOLHDL 6 08/03/2014 0922   VLDL 23.8 08/03/2014 0922   LDLCALC 74 01/02/2015 0820   LDLCALC 134* 08/03/2014 0922   LDLDIRECT 124.7 03/27/2011 1225      Wt Readings from Last 3 Encounters:  04/25/15 89.132 kg (196 lb 8 oz)  02/09/15 89.812 kg (198 lb)  10/02/14 90.32 kg (199 lb 1.9 oz)      Other studies  Reviewed: Additional studies/ records that were reviewed today include: lipid levels from Dr. Hulen Shouts visit. Review of the above records demonstrates: persistently elevated chol levels.  EKG was ordered today. Reveals normal sinus rhythm at 62. He has no ST or T wave changes.  ASSESSMENT AND PLAN:  1.  Hyperlipidemia: Brandon Farrell is doing very well.   His labs looked great. Continues current medications. I'll see him again in 3 months in our [place] office.   Current medicines are reviewed at length with the patient today.  The patient does not have concerns regarding medicines.  The following changes have been made:  See above   Disposition:   FU with me in 6 months .    Signed, Nahser, Wonda Cheng, MD  04/25/2015 4:21 PM    [facility] [place], [place], [place]  [address] Phone: [phone]; Fax: [phone]

## 2015-04-25 NOTE — Patient Instructions (Addendum)
Medication Instructions:  Your physician recommends that you continue on your current medications as directed. Please refer to the Current Medication list given to you today.   Labwork: Your physician recommends that you return for a FASTING lipid and liver profile and BMET in six months. Nothing to eat or drink after midnight the evening before your labs.    Testing/Procedures: none  Follow-Up: Your physician wants you to follow-up in: six months with Dr. Acie Fredrickson in [place]. You will receive a reminder letter in the mail two months in advance. If you don't receive a letter, please call our office to schedule the follow-up appointment.   Any Other Special Instructions Will Be Listed Below (If Applicable).

## 2015-09-03 ENCOUNTER — Other Ambulatory Visit: Payer: Self-pay | Admitting: Family Medicine

## 2015-09-30 ENCOUNTER — Other Ambulatory Visit: Payer: Self-pay | Admitting: Cardiovascular Disease

## 2015-10-02 ENCOUNTER — Other Ambulatory Visit: Payer: Self-pay

## 2015-10-02 ENCOUNTER — Other Ambulatory Visit: Payer: Self-pay | Admitting: Cardiovascular Disease

## 2015-10-02 MED ORDER — ATORVASTATIN CALCIUM 20 MG PO TABS: 20.0000 mg | ORAL_TABLET | Freq: Every day | ORAL | Status: DC

## 2015-10-23 ENCOUNTER — Ambulatory Visit: Payer: BLUE CROSS/BLUE SHIELD | Admitting: Cardiovascular Disease

## 2015-10-25 ENCOUNTER — Ambulatory Visit: Payer: BLUE CROSS/BLUE SHIELD | Admitting: Cardiovascular Disease

## 2015-11-13 ENCOUNTER — Ambulatory Visit (INDEPENDENT_AMBULATORY_CARE_PROVIDER_SITE_OTHER): Payer: BLUE CROSS/BLUE SHIELD | Admitting: Cardiovascular Disease

## 2015-11-13 ENCOUNTER — Encounter: Payer: Self-pay | Admitting: Cardiovascular Disease

## 2015-11-13 VITALS — BP 114/74 | HR 58 | Ht 66.0 in | Wt 198.4 lb

## 2015-11-13 DIAGNOSIS — E785 Hyperlipidemia, unspecified: Secondary | ICD-10-CM | POA: Diagnosis not present

## 2015-11-13 DIAGNOSIS — I10 Essential (primary) hypertension: Secondary | ICD-10-CM

## 2015-11-13 MED ORDER — ATORVASTATIN CALCIUM 20 MG PO TABS: 20.0000 mg | ORAL_TABLET | Freq: Every day | ORAL | Status: DC

## 2015-11-13 NOTE — Progress Notes (Signed)
Cardiology Office Note   Date:  11/13/2015   ID:  Brandon Farrell, Brandon Farrell [birth year], MRN [id number]  PCP:  Arnette Norris, MD  Cardiologist:   Thayer Headings, MD   Chief Complaint  Patient presents with  . Follow-up   1. Hypertriglyceridemia  History of Present Illness:  Brandon Farrell is a 55 yo hispanic gentleman with a hx of hyperlipidemia. He was accompanied by a friend who serves as Astronomer. His son died 48 in his sleep at age 3. He's here today for evaluation and risk reduction. He's not had any cardiac complaints.  Brandon Farrell remains quite active. He plays soccer on regular basis. He's never had episodes of chest pain or shortness breath. He owns [employer]. He has a history of hyperlipidemia and his lipids have been fairly well controlled.  Dec. 4, 2013: Following his initial consultation in August we performed an echocardiogram and Myoview study. The echo revealed normal left ventricular systolic function. The Myoview revealed no evidence of ischemia and also revealed normal left ventricular systolic function.  He hs dizziness for the past 2 weeks when he goes from a lying position to a sitting position or standing position.   Oct. 1, 2014:  Fue presents with some episodes of dizziness. The symptoms were were worse when he would lie back. He has not been playing soccer because of a back issue. He has had a normal echo and a normal myoview study. He has not had any chest pain or dyspnea.   March 22, 2014:  Brandon Farrell is seen today for followup visit. He was seen by Ignacia Bayley, PA for some palpitations. He placed to monitor on him. So far, there have not been any significant arrhythmias   October 02, 2014:   Brandon Farrell is a 55 y.o. male who presents for follow up of his syncope. Seen with Brandon Farrell. No further episode of syncope  Not as active this past winter but plays [employer] on a regular basis.   04/25/2015:  Pt is seen with  Adin Hector, Intrepreter.  Doing ok. Limited by orthopedic injuries , not playing soccer . No CP or dyspnea Was started on Lipitor in March .  Labs in June looked great.   November 13, 2015:  Pt is seen with Georgiann Mohs, intrepreter . Doing well  Walking , not much soccer.     Past Medical History  Diagnosis Date  . Hypertension   . Hypercholesterolemia   . Cholelithiasis   . Palpitations     a. 03/2012 Ex MV: No isch/infarct;  b. 04/2012 Echo: EF 55-65%, mildly dil LA.  Marland Kitchen Vertigo     Past Surgical History  Procedure Laterality Date  . Knee surgery  1999    left  . Laparoscopic cholecystectomy  april 2013     Current Outpatient Prescriptions  Medication Sig Dispense Refill  . atorvastatin (LIPITOR) 20 MG tablet Take 1 tablet (20 mg total) by mouth daily. 90 tablet 0  . Choline Fenofibrate (FENOFIBRIC ACID) 135 MG CPDR TAKE 1 CAPSULE EVERY DAY 30 capsule 4  . valACYclovir (VALTREX) 1000 MG tablet Take 1 tablet (1,000 mg total) by mouth 3 (three) times daily. (Patient not taking: Reported on 04/25/2015) 30 tablet 0   No current facility-administered medications for this visit.    Allergies:   Review of patient's allergies indicates no known allergies.    Social History:  The patient  reports that he has never smoked. He has never used smokeless tobacco.  He reports that he drinks about 1.8 oz of alcohol per week. He reports that he does not use illicit drugs.   Family History:  The patient's family history includes Heart disease in his son. There is no history of Colon cancer or Stomach cancer.    ROS:  Please see the history of present illness.    Review of Systems: Constitutional:  denies fever, chills, diaphoresis, appetite change and fatigue.  HEENT: denies photophobia, eye pain, redness, hearing loss, ear pain, congestion, sore throat, rhinorrhea, sneezing, neck pain, neck stiffness and tinnitus.  Respiratory: denies SOB, DOE, cough, chest tightness, and  wheezing.  Cardiovascular: denies chest pain, palpitations and leg swelling.  Gastrointestinal: denies nausea, vomiting, abdominal pain, diarrhea, constipation, blood in stool.  Genitourinary: denies dysuria, urgency, frequency, hematuria, flank pain and difficulty urinating.  Musculoskeletal: denies  myalgias, back pain, joint swelling, arthralgias and gait problem.   Skin: denies pallor, rash and wound.  Neurological: denies dizziness, seizures, syncope, weakness, light-headedness, numbness and headaches.   Hematological: denies adenopathy, easy bruising, personal or family bleeding history.  Psychiatric/ Behavioral: denies suicidal ideation, mood changes, confusion, nervousness, sleep disturbance and agitation.       All other systems are reviewed and negative.    PHYSICAL EXAM: VS:  BP 114/74 mmHg  Pulse 58  Ht 5\' 6"  (1.676 m)  Wt 198 lb 6.4 oz (89.994 kg)  BMI 32.04 kg/m2  SpO2 97% , BMI Body mass index is 32.04 kg/(m^2). GEN: Well nourished, well developed, in no acute distress HEENT: normal Neck: no JVD, carotid bruits, or masses Cardiac: RRR; no murmurs, rubs, or gallops,no edema  Respiratory:  clear to auscultation bilaterally, normal work of breathing GI: soft, nontender, nondistended, + BS MS: no deformity or atrophy Skin: warm and dry, no rash Neuro:  Strength and sensation are intact Psych: normal  EKG:  EKG is not ordered today.  Recent Labs: 01/02/2015: ALT 32; BUN 19; Creatinine, Ser 1.22; Potassium 4.4; Sodium 143    Lipid Panel    Component Value Date/Time   CHOL 127 01/02/2015 0820   CHOL 189 08/03/2014 0922   TRIG 97 01/02/2015 0820   HDL 34* 01/02/2015 0820   HDL 31.70* 08/03/2014 0922   CHOLHDL 3.7 01/02/2015 0820   CHOLHDL 6 08/03/2014 0922   VLDL 23.8 08/03/2014 0922   LDLCALC 74 01/02/2015 0820   LDLCALC 134* 08/03/2014 0922   LDLDIRECT 124.7 03/27/2011 1225      Wt Readings from Last 3 Encounters:  11/13/15 198 lb 6.4 oz (89.994 kg)    04/25/15 196 lb 8 oz (89.132 kg)  02/09/15 198 lb (89.812 kg)      Other studies Reviewed: Additional studies/ records that were reviewed today include: lipid levels from Dr. Hulen Shouts visit. Review of the above records demonstrates: persistently elevated chol levels.  EKG was ordered today. Reveals normal sinus rhythm at 62. He has no ST or T wave changes.  ASSESSMENT AND PLAN:  1.  Hyperlipidemia: Laquincy is doing very well.   His labs looked great. Continues current medications. I'll see him again in 3 months in our [place] office.   Current medicines are reviewed at length with the patient today.  The patient does not have concerns regarding medicines.  The following changes have been made:  See above   Disposition:   FU with me in 6 months .    Signed, Oleta Gunnoe, Wonda Cheng, MD  11/13/2015 12:01 PM    [facility] [id number]  [address], [place], [place]  [address] Phone: [phone]; Fax: [phone]

## 2015-11-13 NOTE — Patient Instructions (Signed)
Medication Instructions:  Your physician recommends that you continue on your current medications as directed. Please refer to the Current Medication list given to you today.   Labwork: Your physician recommends that you return for lab work in: 3 months  You will need to FAST for this appointment - nothing to eat or drink after midnight the night before except water.   Testing/Procedures: None Ordered   Follow-Up: Your physician wants you to follow-up in: 1 year with Dr. Nahser.  You will receive a reminder letter in the mail two months in advance. If you don't receive a letter, please call our office to schedule the follow-up appointment.   If you need a refill on your cardiac medications before your next appointment, please call your pharmacy.   Thank you for choosing [facility]! Michelle Swinyer, RN [phone]    

## 2016-01-16 ENCOUNTER — Other Ambulatory Visit: Payer: Self-pay | Admitting: *Deleted

## 2016-01-16 NOTE — Telephone Encounter (Signed)
Received faxed refill request from pharmacy for Fenofibric 135 mg Last office visit 02/09/15/acute Patient has been seeing his cardiologist

## 2016-01-16 NOTE — Telephone Encounter (Signed)
If not following up here, needs to be refilled by cardiology.

## 2016-02-04 ENCOUNTER — Other Ambulatory Visit (INDEPENDENT_AMBULATORY_CARE_PROVIDER_SITE_OTHER): Payer: BLUE CROSS/BLUE SHIELD | Admitting: *Deleted

## 2016-02-04 DIAGNOSIS — I10 Essential (primary) hypertension: Secondary | ICD-10-CM

## 2016-02-04 DIAGNOSIS — E785 Hyperlipidemia, unspecified: Secondary | ICD-10-CM

## 2016-02-04 LAB — LIPID PANEL
Cholesterol: 129 mg/dL (ref 125–200)
HDL: 35 mg/dL — AB (ref >=40)
LDL CALC: 76 mg/dL (ref <130)
Total CHOL/HDL Ratio: 3.7 Ratio (ref <=5.0)
Triglycerides: 89 mg/dL (ref <150)
VLDL: 18 mg/dL (ref <30)

## 2016-02-04 LAB — COMPREHENSIVE METABOLIC PANEL
ALBUMIN: 4.3 g/dL (ref 3.6–5.1)
ALT: 30 U/L (ref 9–46)
AST: 25 U/L (ref 10–35)
Alkaline Phosphatase: 53 U/L (ref 40–115)
BUN: 15 mg/dL (ref 7–25)
CHLORIDE: 106 mmol/L (ref 98–110)
CO2: 25 mmol/L (ref 20–31)
Calcium: 9.1 mg/dL (ref 8.6–10.3)
Creat: 1.18 mg/dL (ref 0.70–1.33)
Glucose, Bld: 99 mg/dL (ref 65–99)
POTASSIUM: 4.1 mmol/L (ref 3.5–5.3)
Sodium: 138 mmol/L (ref 135–146)
TOTAL PROTEIN: 6.3 g/dL (ref 6.1–8.1)
Total Bilirubin: 0.7 mg/dL (ref 0.2–1.2)

## 2016-02-04 NOTE — Addendum Note (Signed)
Addended by: Eulis Foster on: 02/04/2016 08:28 AM   Modules accepted: Orders

## 2016-02-07 ENCOUNTER — Encounter: Payer: Self-pay | Admitting: Nurse Practitioner

## 2016-03-03 ENCOUNTER — Ambulatory Visit (INDEPENDENT_AMBULATORY_CARE_PROVIDER_SITE_OTHER): Payer: BLUE CROSS/BLUE SHIELD | Admitting: Family Medicine

## 2016-03-03 ENCOUNTER — Encounter: Payer: Self-pay | Admitting: Family Medicine

## 2016-03-03 ENCOUNTER — Ambulatory Visit (INDEPENDENT_AMBULATORY_CARE_PROVIDER_SITE_OTHER)
Admission: RE | Admit: 2016-03-03 | Discharge: 2016-03-03 | Disposition: A | Discharge status: Home / Self Care | Payer: BLUE CROSS/BLUE SHIELD | Source: Ambulatory Visit | Attending: Family Medicine | Admitting: Family Medicine

## 2016-03-03 VITALS — BP 112/70 | HR 61 | Temp 98.0°F | Wt 199.8 lb

## 2016-03-03 DIAGNOSIS — Z23 Encounter for immunization: Secondary | ICD-10-CM | POA: Diagnosis not present

## 2016-03-03 DIAGNOSIS — S8992XA Unspecified injury of left lower leg, initial encounter: Secondary | ICD-10-CM | POA: Diagnosis not present

## 2016-03-03 NOTE — Progress Notes (Signed)
SUBJECTIVE: Brandon Farrell is a 55 y.o. male who sustained a left knee injury 1 week(s) ago. Mechanism of injury: "stepped off of a step funny." Immediate symptoms: immediate pain, inability to bear weight directly after injury. Symptoms have been improving since that time. Prior history of related problems: no prior problems with this area in the past.  Current Outpatient Prescriptions on File Prior to Visit  Medication Sig Dispense Refill  . atorvastatin (LIPITOR) 20 MG tablet Take 1 tablet (20 mg total) by mouth daily. 90 tablet 3  . Choline Fenofibrate (FENOFIBRIC ACID) 135 MG CPDR TAKE 1 CAPSULE EVERY DAY 30 capsule 4   No current facility-administered medications on file prior to visit.     No Known Allergies  Past Medical History:  Diagnosis Date  . Cholelithiasis   . Hypercholesterolemia   . Hypertension   . Palpitations    a. 03/2012 Ex MV: No isch/infarct;  b. 04/2012 Echo: EF 55-65%, mildly dil LA.  Marland Kitchen Vertigo     Past Surgical History:  Procedure Laterality Date  . [place]   left  . LAPAROSCOPIC CHOLECYSTECTOMY  april 2013    Family History  Problem Relation Age of Onset  . Heart disease Son   . Colon cancer Neg Hx   . Stomach cancer Neg Hx     Social History   Social History  . Marital status: Married    Spouse name: N/A  . Number of children: 2  . Years of education: N/A   Occupational History  . [employer]   .  Self Employed   Social History Main Topics  . Smoking status: Never Smoker  . Smokeless tobacco: Never Used  . Alcohol use 1.8 oz/week    3 Cans of beer per week     Comment: occasional  . Drug use: No  . Sexual activity: Not on file   Other Topics Concern  . Not on file   Social History Narrative  . No narrative on file   The PMH, PSH, Social History, Family History, Medications, and allergies have been reviewed in [facility], and have been updated if relevant.  OBJECTIVE: BP 112/70   Pulse 61   Temp 98 F (36.7 C)  (Oral)   Wt 199 lb 12 oz (90.6 kg)   SpO2 94%   BMI 32.24 kg/m   Vital signs as noted above. Appearance: alert, well appearing, and in no distress. Knee exam: negative drawer sign, ligamentous instability medially, negative Lachman sign, patellar tenderness, tenderness over tibial tubercle, normal ipsilateral hip exam. X-ray: ordered, but results not yet available.  ASSESSMENT: Knee meniscal injury  PLAN: rest the injured area as much as practical See orders for this visit as documented in the electronic medical record.

## 2016-03-03 NOTE — Patient Instructions (Signed)
Great to see you.  We will call you with your xray results.   

## 2016-03-03 NOTE — Progress Notes (Signed)
Pre visit review using our clinic review tool, if applicable. No additional management support is needed unless otherwise documented below in the visit note. 

## 2016-03-03 NOTE — Addendum Note (Signed)
Addended by: Modena Nunnery on: 03/03/2016 02:25 PM   Modules accepted: Orders

## 2016-03-05 ENCOUNTER — Encounter: Payer: Self-pay | Admitting: *Deleted

## 2016-03-07 ENCOUNTER — Telehealth: Payer: Self-pay

## 2016-03-07 ENCOUNTER — Encounter: Payer: Self-pay | Admitting: Family Medicine

## 2016-03-07 ENCOUNTER — Ambulatory Visit (INDEPENDENT_AMBULATORY_CARE_PROVIDER_SITE_OTHER): Payer: BLUE CROSS/BLUE SHIELD | Admitting: Family Medicine

## 2016-03-07 VITALS — BP 116/66 | HR 97 | Temp 98.0°F | Wt 198.8 lb

## 2016-03-07 DIAGNOSIS — K648 Other hemorrhoids: Secondary | ICD-10-CM

## 2016-03-07 DIAGNOSIS — R1032 Left lower quadrant pain: Secondary | ICD-10-CM | POA: Diagnosis not present

## 2016-03-07 DIAGNOSIS — K5900 Constipation, unspecified: Secondary | ICD-10-CM | POA: Diagnosis not present

## 2016-03-07 DIAGNOSIS — K921 Melena: Secondary | ICD-10-CM | POA: Diagnosis not present

## 2016-03-07 NOTE — Progress Notes (Signed)
   Subjective:    Patient ID: Brandon Farrell, male    DOB: 1960-11-01, 55 y.o.   MRN: [id number]  HPI   55 year old  male pt of  Dr. Hulen Shouts  Presents with new onset 3 days ago left lower abd pain. Comes and goes. Has urge to defecate.. But has not had a bowel movement,  Last BM this AM hard pellets. Improves some after BM. Pain on pain scale 7/10. No fever. No N/V. Red blood , small amount  In stool.  Tried aleve .Marland Kitchen Did not help much.  Social History /Family History/Past Medical History reviewed and updated if needed. No past history of GU issues. No past colonoscopy.   Review of Systems  Constitutional: Negative for fatigue.  HENT: Negative for ear pain.   Eyes: Negative for pain.  Respiratory: Negative for shortness of breath.   Cardiovascular: Negative for chest pain.       Objective:   Physical Exam  Constitutional: Vital signs are normal. He appears well-developed and well-nourished.  Non-toxic appearance. He does not have a sickly appearance. He does not appear ill. No distress.  HENT:  Head: Normocephalic.  Right Ear: Hearing normal.  Left Ear: Hearing normal.  Nose: Nose normal.  Mouth/Throat: Oropharynx is clear and moist and mucous membranes are normal.  Neck: Trachea normal. Carotid bruit is not present. No thyroid mass and no thyromegaly present.  Cardiovascular: Normal rate, regular rhythm and normal pulses.  Exam reveals no gallop, no distant heart sounds and no friction rub.   No murmur heard. No peripheral edema  Pulmonary/Chest: Effort normal and breath sounds normal. No respiratory distress.  Abdominal: There is no hepatosplenomegaly. There is tenderness in the left lower quadrant. There is rebound. There is no rigidity, no guarding and no CVA tenderness. No hernia.  Genitourinary: Rectal exam shows internal hemorrhoid. Rectal exam shows no external hemorrhoid, no fissure, no mass, no tenderness, anal tone normal and guaiac negative stool.  Genitourinary  Comments: Anoscopy performed without complications: grade1 int hemorrhoids seen, non bleeding  Skin: Skin is warm, dry and intact. No rash noted.  Psychiatric: He has a normal mood and affect. His speech is normal and behavior is normal. Thought content normal.         Assessment & Plan:

## 2016-03-07 NOTE — Assessment & Plan Note (Signed)
Most consistent with constipation.  Treat with miralax, water and fiber.  If fever, worsening pain pt to call for further eval.  Consider  CT fo diverticulitis if not improving.

## 2016-03-07 NOTE — Telephone Encounter (Signed)
Discussed appt with daughter in detail and answered questions.

## 2016-03-07 NOTE — Telephone Encounter (Signed)
Mickel Baas pts daughter left v/m (DPR signed);pt was seen earlier today and Mickel Baas did not go back with pt;pt had interpreter. Mickel Baas request cb with information about todays visit; pt had abd pain.Mickel Baas is not sure if pt is telling her everything. Office note in [place].

## 2016-03-07 NOTE — Progress Notes (Signed)
Pre visit review using our clinic review tool, if applicable. No additional management support is needed unless otherwise documented below in the visit note. 

## 2016-03-07 NOTE — Patient Instructions (Addendum)
Increase fiber and water in diet. Start miralax 17 g daily until softer BMs.  Go to ER if severe pain. Estreimiento - Adultos (Constipation, Adult) Estreimiento significa que una persona tiene menos de tres evacuaciones en una semana, dificultad para defecar, o que las heces son secas, duras, o ms grandes que lo normal. A medida que envejecemos el estreimiento es ms comn. Una dieta baja en fibra, no tomar suficientes lquidos y el uso de ciertos medicamentos pueden Agricultural engineer.  CAUSAS   Ciertos medicamentos, como los antidepresivos, analgsicos, suplementos de hierro, anticidos y diurticos.  Algunas enfermedades, como la diabetes, el sndrome del colon irritable, enfermedad de la tiroides, o depresin.  No beber suficiente agua.  No consumir suficientes alimentos ricos en fibra.  Situaciones de estrs o viajes.  Falta de actividad fsica o de ejercicio.  Ignorar la necesidad sbita de Landscape architect.  Uso en exceso de laxantes. SIGNOS Y SNTOMAS   Defecar menos de tres veces por semana.  Dificultad para defecar.  Tener las heces secas y duras, o ms grandes que las normales.  Sensacin de estar lleno o hinchado.  Dolor en la parte baja del abdomen.  No sentir alivio despus de defecar. DIAGNSTICO  El mdico le har una historia clnica y un examen fsico. Pueden hacerle exmenes adicionales para el estreimiento grave. Estos estudios pueden ser:  Un radiografa con enema de bario para examinar el recto, el colon y, en algunos casos, el intestino delgado.  Una sigmoidoscopia para examinar el colon inferior.  Una colonoscopia para examinar todo el colon. TRATAMIENTO  El tratamiento depender de la gravedad del estreimiento y de la causa. Algunos tratamientos nutricionales son beber ms lquidos y comer ms alimentos ricos en fibra. El cambio en el estilo de vida incluye hacer ejercicios de [place] regular. Si estas recomendaciones para IT trainer dieta y en el estilo de vida no ayudan, el mdico le puede indicar el uso de laxantes de venta libre para ayudarlo a Landscape architect. Los medicamentos recetados se pueden prescribir si los medicamentos de venta libre no lo [place].  INSTRUCCIONES PARA EL CUIDADO EN EL HOGAR   Consuma alimentos con alto contenido de [place], como frutas, vegetales, cereales integrales y porotos.  Limite los alimentos procesados ricos en grasas y azcar, como las papas fritas, hamburguesas, galletas, dulces y refrescos.  Puede agregar un suplemento de fibra a su dieta si no obtiene lo suficiente de los alimentos.  Beba suficiente lquido para Consulting civil engineer orina clara o de color amarillo plido.  Haga ejercicio regularmente o segn las indicaciones del mdico.  Vaya al bao cuando sienta la necesidad de ir. No se aguante las ganas.  Tome solo medicamentos de venta libre o recetados, segn las indicaciones del mdico. No tome otros medicamentos para el estreimiento sin consultarlo antes con su mdico. SOLICITE ATENCIN MDICA DE INMEDIATO SI:   Observa sangre brillante en las heces.  El estreimiento dura ms de 4 das o [place].  Siente dolor abdominal o rectal.  Las heces son delgadas como un lpiz.  Pierde peso de [place] inexplicable. ASEGRESE DE QUE:   Comprende estas instrucciones.  Controlar su afeccin.  Recibir ayuda de inmediato si no mejora o si empeora.   Esta informacin no tiene Marine scientist el consejo del mdico. Asegrese de hacerle al mdico cualquier pregunta que tenga.   Document Released: 08/03/2007 Document Revised: 08/04/2014 Elsevier Interactive Patient Education 2016 [place] en fibra (High-Fiber Diet) Joya Martyr, tambin llamada  fibra dietaria, es un tipo de carbohidrato que se encuentra en las frutas, las verduras, los cereales integrales y los frijoles. Una dieta rica en fibra puede tener muchos beneficios para la salud. El mdico puede recomendar una  dieta rica en fibra para ayudar a:  Contractor. La fibra puede hacer que defeque con ms frecuencia.  Disminuir el nivel de colesterol.  [place] hemorroides, la diverticulosis no complicada o el sndrome del intestino irritable.  Evitar comer en exceso como parte de un plan para bajar de peso.  Evitar cardiopatas, la diabetes tipo 2 y ciertos cnceres. EN QU CONSISTE EL PLAN? El consumo diario recomendado de fibra incluye lo siguiente:  38gramos para hombres menores de 22 aos.  30gramos para hombres mayores de 50 aos.  25gramos para mujeres menores de 50 aos.  21gramos para mujeres mayores de 50 aos. Puede lograr el consumo diario recomendado de fibra si come una variedad de frutas, verduras, cereales y frijoles. El mdico tambin puede recomendar un suplemento de fibra si no es posible obtener suficiente fibra a travs de la dieta. QU DEBO SABER ACERCA DE [place]?  La eficacia de los suplementos de [place] no ha sido estudiada [place], de modo que es mejor obtener fibra a travs de los alimentos.  Verifique siempre el contenido de fibra en la etiqueta de informacin nutricional de los alimentos preenvasados. Busque alimentos que contengan al menos 5gramos de fibra por porcin.  Consulte al nutricionista si tiene preguntas sobre algunos alimentos especficos relacionados con su enfermedad, especialmente si estos alimentos no se mencionan a continuacin.  Aumente el consumo diario de fibra en forma gradual. Aumentar demasiado rpido el consumo de fibra dietaria puede provocar meteorismo, clicos o gases.  Beber abundante agua. El [place] a Economist. QU ALIMENTOS PUEDO COMER? Cereales Panes integrales. Multicereales. Avena. Arroz integral. Dwyane Luo. Trigo burgol. Mijo. Muffins de salvado. Palomitas de maz. Galletas de centeno. Verduras Batatas. [place]. Col rizada. [place]. Repollo. Brcoli. Guisantes. Zan[place]orias.  [place]. Frutas Frutos rojos. Peras. Manzanas. Naranjas [place]. Ciruelas y pasas. Higos secos. Carnes y otras fuentes de protenas Frijoles blancos, colorados, pintos y porotos de soja. Guisantes secos. Lentejas. Frutos secos y semillas. Lcteos Yogur fortificado con Pharmacist, hospital. Bebidas Leche de soja fortificada con Fredderick [place]. Jugo de naranja fortificado con Fredderick [place]. Otros Barras de H. [place]. Los artculos mencionados arriba pueden no ser [employer] de las bebidas o los alimentos recomendados. Comunquese con el nutricionista para conocer ms opciones. QU ALIMENTOS NO SE RECOMIENDAN? Cereales Pan blanco. Pastas hechas con Letitia Neri. Arroz blanco. Verduras Papas fritas. Verduras enlatadas. Verduras bien cocidas.  Frutas Jugo de frutas. Frutas cocidas coladas. Carnes y otras fuentes de protenas Cortes de carne con Lobbyist. Aves o pescados fritos. Lcteos Leche. Yogur. Queso crema. [employer]. Bebidas Gaseosas. Otros Tortas y pasteles. [place] y aceites. Los artculos mencionados arriba pueden no ser [employer] de las bebidas y los alimentos que se Higher education careers adviser. Comunquese con el nutricionista para obtener ms informacin. ALGUNOS CONSEJOS PARA INCLUIR ALIMENTOS RICOS EN FIBRA EN LA DIETA  Consuma una gran variedad de alimentos ricos en fibra.  Asegrese de que la mitad de todos los cereales consumidos cada da sean cereales integrales.  Reemplace los panes y cereales hechos de harina refinada o harina blanca por panes y cereales integrales.  Reemplace el arroz blanco por arroz integral, trigo burgol o mijo.  Comience Games developer con un desayuno rico en [place], como un cereal que contenga al [employer]  5gramos de fibra por porcin.  Use guisantes en lugar de carne en las sopas, ensaladas o pastas.  Coma bocadillos ricos en fibra, como frutos rojos, verduras crudas, frutos secos o palomitas de maz.   Esta informacin no tiene Marine scientist el consejo del  mdico. Asegrese de hacerle al mdico cualquier pregunta que tenga.   Document Released: 07/14/2005 Document Revised: 08/04/2014 Elsevier Interactive Patient Education [employer].

## 2016-03-07 NOTE — Assessment & Plan Note (Signed)
Likely secondary to hemorrhoids. Treat with treatment of constipation.

## 2016-03-11 ENCOUNTER — Telehealth: Payer: Self-pay | Admitting: Family Medicine

## 2016-03-11 DIAGNOSIS — S8992XA Unspecified injury of left lower leg, initial encounter: Secondary | ICD-10-CM

## 2016-03-11 NOTE — Telephone Encounter (Signed)
Referral placed.

## 2016-03-11 NOTE — Telephone Encounter (Signed)
Daughter called requesting a referral for knee pain. Daughter states pt is having more pain and no longer able to put weight on the knee.  Knee is also stiff.  Pt requests [place].  cb number is [phone] Thanks

## 2016-03-19 ENCOUNTER — Other Ambulatory Visit: Payer: Self-pay | Admitting: Family Medicine

## 2016-06-23 ENCOUNTER — Ambulatory Visit: Payer: BLUE CROSS/BLUE SHIELD | Admitting: Internal Medicine

## 2016-07-30 ENCOUNTER — Encounter: Payer: Self-pay | Admitting: Family Medicine

## 2016-07-30 ENCOUNTER — Ambulatory Visit (INDEPENDENT_AMBULATORY_CARE_PROVIDER_SITE_OTHER): Payer: BLUE CROSS/BLUE SHIELD | Admitting: Family Medicine

## 2016-07-30 VITALS — BP 124/62 | HR 86 | Temp 98.0°F | Wt 204.2 lb

## 2016-07-30 DIAGNOSIS — Z23 Encounter for immunization: Secondary | ICD-10-CM | POA: Diagnosis not present

## 2016-07-30 DIAGNOSIS — L989 Disorder of the skin and subcutaneous tissue, unspecified: Secondary | ICD-10-CM | POA: Diagnosis not present

## 2016-07-30 NOTE — Progress Notes (Signed)
Pre visit review using our clinic review tool, if applicable. No additional management support is needed unless otherwise documented below in the visit note. 

## 2016-07-30 NOTE — Assessment & Plan Note (Signed)
New- given location, will refer to dermatology for biopsy/removal. The patient indicates understanding of these issues and agrees with the plan.

## 2016-07-30 NOTE — Patient Instructions (Signed)
Great to see you. Please stop by to see [place] on your way out.   

## 2016-07-30 NOTE — Progress Notes (Signed)
   Subjective:   Patient ID: Brandon Farrell, male    DOB: Sep 11, 1960, 56 y.o.   MRN: [id number]  Brandon Farrell is a pleasant 56 y.o. year old male who presents to clinic today with spot on nose  on 07/30/2016  HPI:  Skin lesion on his nose- noticed it six months ago.  He is not sure if it has grown in size.  Not itchy or painful but sometimes "irritated."  Does not bleed.  Has not tried any topical medication for it.  Current Outpatient Prescriptions on File Prior to Visit  Medication Sig Dispense Refill  . atorvastatin (LIPITOR) 20 MG tablet Take 1 tablet (20 mg total) by mouth daily. 90 tablet 3  . Choline Fenofibrate (FENOFIBRIC ACID) 135 MG CPDR TAKE 1 CAPSULE EVERY DAY 90 capsule 1   No current facility-administered medications on file prior to visit.     No Known Allergies  Past Medical History:  Diagnosis Date  . Cholelithiasis   . Hypercholesterolemia   . Hypertension   . Palpitations    a. 03/2012 Ex MV: No isch/infarct;  b. 04/2012 Echo: EF 55-65%, mildly dil LA.  Marland Kitchen Vertigo     Past Surgical History:  Procedure Laterality Date  . [place]   left  . LAPAROSCOPIC CHOLECYSTECTOMY  april 2013    Family History  Problem Relation Age of Onset  . Heart disease Son   . Colon cancer Neg Hx   . Stomach cancer Neg Hx     Social History   Social History  . Marital status: Married    Spouse name: N/A  . Number of children: 2  . Years of education: N/A   Occupational History  . [employer]   .  Self Employed   Social History Main Topics  . Smoking status: Never Smoker  . Smokeless tobacco: Never Used  . Alcohol use 1.8 oz/week    3 Cans of beer per week     Comment: occasional  . Drug use: No  . Sexual activity: Not on file   Other Topics Concern  . Not on file   Social History Narrative  . No narrative on file   The PMH, PSH, Social History, Family History, Medications, and allergies have been reviewed in [facility], and have been updated if  relevant.   Review of Systems  Constitutional: Negative.   Skin:       +lesion  Neurological: Negative.   All other systems reviewed and are negative.      Objective:    BP 124/62   Pulse 86   Temp 98 F (36.7 C) (Oral)   Wt 204 lb 4 oz (92.6 kg)   SpO2 95%   BMI 32.97 kg/m    Physical Exam  Constitutional: He is oriented to person, place, and time. He appears well-developed and well-nourished. No distress.  HENT:  Head: Normocephalic and atraumatic.  Eyes: Conjunctivae are normal.  Cardiovascular: Normal rate.   Pulmonary/Chest: Effort normal.  Musculoskeletal: Normal range of motion.  Neurological: He is alert and oriented to person, place, and time. No cranial nerve deficit.  Skin: He is not diaphoretic.     Psychiatric: He has a normal mood and affect. His behavior is normal. Judgment and thought content normal.  Nursing note and vitals reviewed.         Assessment & Plan:   Skin lesion - Plan: Ambulatory referral to Dermatology No Follow-up on file.

## 2016-09-22 ENCOUNTER — Encounter: Payer: Self-pay | Admitting: Family Medicine

## 2016-09-22 ENCOUNTER — Ambulatory Visit (INDEPENDENT_AMBULATORY_CARE_PROVIDER_SITE_OTHER): Payer: Self-pay | Admitting: Family Medicine

## 2016-09-22 VITALS — BP 110/74 | HR 64 | Temp 98.7°F | Ht 66.0 in | Wt 206.2 lb

## 2016-09-22 DIAGNOSIS — M25562 Pain in left knee: Secondary | ICD-10-CM

## 2016-09-22 MED ORDER — DICLOFENAC SODIUM 75 MG PO TBEC: 75.0000 mg | DELAYED_RELEASE_TABLET | Freq: Two times a day (BID) | ORAL | 3 refills | Status: DC

## 2016-09-22 MED ORDER — METHYLPREDNISOLONE ACETATE 40 MG/ML IJ SUSP
80.0000 mg | Freq: Once | INTRAMUSCULAR | Status: AC
  Administered 2016-09-22: 80 mg via INTRA_ARTICULAR

## 2016-09-22 NOTE — Progress Notes (Signed)
Dr. Frederico Hamman T. Johnay Mano, MD, [facility] [place], [address] Phone: [phone] Fax: [phone]  09/22/2016  Patient: Brandon Farrell, MRN: [id number], DOB: 08-01-1960, 56 y.o.  Primary Physician:  Arnette Norris, MD   Chief Complaint  Patient presents with  . Knee Pain    Left   Subjective:   Brandon Farrell is a 56 y.o. very pleasant male patient who presents with the following:  Left knee - bothering him.  On the way back from from [place], when walking down the stairs, popped and give out.  No swelling.  Has been bothering for about 20 days.  Has tried nothing.   Something popping  Some giving way.  Also with trying to turn. Even when he is in the bed.   History is significant for prior arthroscopy on the left knee, greater than 10 years ago.  Past Medical History, Surgical History, Social History, Family History, Problem List, Medications, and Allergies have been reviewed and updated if relevant.  Patient Active Problem List   Diagnosis Date Noted  . Skin lesion 07/30/2016  . Abdominal pain, left lower quadrant 03/07/2016  . Hematochezia 03/07/2016  . Hyperlipidemia 10/02/2014  . Left knee pain 08/03/2014  . Dry eye syndrome 08/03/2014  . Acute allergic conjunctivitis of both eyes 08/03/2014  . Palpitations 03/22/2014  . Arthritis 12/22/2013  . Vertigo 03/09/2013  . Routine general medical examination at a health care facility 03/23/2012  . Family history of MI (myocardial infarction) 03/23/2012  . HYPERTRIGLYCERIDEMIA 06/18/2007  . HYPERLIPIDEMIA 06/18/2007  . HYPERGLYCEMIA 06/18/2007  . OBESITY 06/11/2007  . GERD 06/11/2007  . VITILIGO 06/11/2007    Past Medical History:  Diagnosis Date  . Cholelithiasis   . Hypercholesterolemia   . Hypertension   . Palpitations    a. 03/2012 Ex MV: No isch/infarct;  b. 04/2012 Echo: EF 55-65%, mildly dil LA.  Marland Kitchen Vertigo     Past Surgical History:  Procedure  Laterality Date  . [place]   left  . LAPAROSCOPIC CHOLECYSTECTOMY  april 2013    Social History   Social History  . Marital status: Married    Spouse name: N/A  . Number of children: 2  . Years of education: N/A   Occupational History  . [employer]   .  Self Employed   Social History Main Topics  . Smoking status: Never Smoker  . Smokeless tobacco: Never Used  . Alcohol use 1.8 oz/week    3 Cans of beer per week     Comment: occasional  . Drug use: No  . Sexual activity: Not on file   Other Topics Concern  . Not on file   Social History Narrative  . No narrative on file    Family History  Problem Relation Age of Onset  . Heart disease Son   . Colon cancer Neg Hx   . Stomach cancer Neg Hx     No Known Allergies  Medication list reviewed and updated in full in [facility].  GEN: No fevers, chills. Nontoxic. Primarily MSK c/o today. MSK: Detailed in the HPI GI: tolerating PO intake without difficulty Neuro: No numbness, parasthesias, or tingling associated. Otherwise the pertinent positives of the ROS are noted above.   Objective:   BP 110/74   Pulse 64   Temp 98.7 F (37.1 C) (Oral)   Ht 5\' 6"  (1.676 m)   Wt 206 lb 4 oz (93.6  kg)   BMI 33.29 kg/m    GEN: WDWN, NAD, Non-toxic, Alert & Oriented x 3 HEENT: Atraumatic, Normocephalic.  Ears and Nose: No external deformity. EXTR: No clubbing/cyanosis/edema NEURO: Normal gait.  PSYCH: Normally interactive. Conversant. Not depressed or anxious appearing.  Calm demeanor.   Knee:  L Gait: Normal heel toe pattern ROM: 0-125 Effusion: neg Echymosis or edema: none Patellar tendon NT Painful PLICA: neg Patellar grind: negative Medial and lateral patellar facet loading: negative medial and lateral joint lines: medial ttp Mcmurray's POS with mechanical pop Flexion-pinch pos Varus and valgus stress: stable Lachman: neg Ant and Post drawer: neg Hip abduction, IR, ER: WNL Hip  flexion str: 5/5 Hip abd: 5/5 Quad: 5/5 VMO atrophy:No Hamstring concentric and eccentric: 5/5   Radiology: Dg Knee Complete 4 Views Left  Result Date: 03/03/2016 CLINICAL DATA:  Left knee pain following an injury going down stairs 10 days ago. Previous left knee surgery 22 years ago. EXAM: LEFT KNEE - COMPLETE 4+ VIEW COMPARISON:  08/03/2014. FINDINGS: Moderate lateral joint space narrowing and associated spur formation. Mild medial and tibial spine spur formation. Moderate patellofemoral spur formation. No fracture, dislocation or effusion. IMPRESSION: 1. Tricompartmental degenerative changes. 2. No fracture or effusion. Electronically Signed   By: Claudie Revering M.D.   On: 03/03/2016 16:27   Assessment and Plan:   Left knee pain, unspecified chronicity - Plan: methylPREDNISolone acetate (DEPO-MEDROL) injection 80 mg  Most consistent with meniscal tear clinically and by exam.  Discussed with the patient with known tricompartmental arthritis at age 56, often conservative treatment will do just as well as surgical, so we will try conservative care and see how he does.  Advanced activity as tolerated.  Baseline strength is very good.  Anti-inflammatories and intra-articular knee injection.  Knee Injection, L Patient verbally consented to procedure. Risks (including potential rare risk of infection), benefits, and alternatives explained. Sterilely prepped with Chloraprep. Ethyl cholride used for anesthesia. 8 cc Lidocaine 1% mixed with 2 mL Depo-Medrol 40 mg injected using the anteromedial approach without difficulty. No complications with procedure and tolerated well. Patient had decreased pain post-injection.   Follow-up: Return in about 6 weeks (around 11/03/2016).  Meds ordered this encounter  Medications  . diclofenac (VOLTAREN) 75 MG EC tablet    Sig: Take 1 tablet (75 mg total) by mouth 2 (two) times daily. Spanish labels    Dispense:  60 tablet    Refill:  3  . methylPREDNISolone  acetate (DEPO-MEDROL) injection 80 mg   Signed,  Sowmya Partridge T. Gaylan Fauver, MD   Allergies as of 09/22/2016   No Known Allergies     Medication List       Accurate as of 09/22/16 11:59 PM. Always use your most recent med list.          atorvastatin 20 MG tablet Commonly known as:  LIPITOR Take 1 tablet (20 mg total) by mouth daily.   diclofenac 75 MG EC tablet Commonly known as:  VOLTAREN Take 1 tablet (75 mg total) by mouth 2 (two) times daily. Spanish labels   Fenofibric Acid 135 MG Cpdr TAKE 1 CAPSULE EVERY DAY

## 2016-09-22 NOTE — Progress Notes (Signed)
Pre visit review using our clinic review tool, if applicable. No additional management support is needed unless otherwise documented below in the visit note. 

## 2016-09-30 ENCOUNTER — Other Ambulatory Visit: Payer: Self-pay | Admitting: Family Medicine

## 2016-10-09 ENCOUNTER — Encounter: Payer: Self-pay | Admitting: Family Medicine

## 2016-10-09 ENCOUNTER — Ambulatory Visit (INDEPENDENT_AMBULATORY_CARE_PROVIDER_SITE_OTHER): Payer: BLUE CROSS/BLUE SHIELD | Admitting: Family Medicine

## 2016-10-09 DIAGNOSIS — R3912 Poor urinary stream: Secondary | ICD-10-CM | POA: Diagnosis not present

## 2016-10-09 LAB — POC URINALSYSI DIPSTICK (AUTOMATED)
Bilirubin, UA: NEGATIVE
GLUCOSE UA: NEGATIVE
Ketones, UA: NEGATIVE
Nitrite, UA: NEGATIVE
Protein, UA: NEGATIVE
SPEC GRAV UA: 1.03
UROBILINOGEN UA: 0.2
pH, UA: 6

## 2016-10-09 MED ORDER — CIPROFLOXACIN HCL 500 MG PO TABS: 500.0000 mg | ORAL_TABLET | Freq: Two times a day (BID) | ORAL | 0 refills | Status: AC

## 2016-10-09 NOTE — Progress Notes (Signed)
Pre visit review using our clinic review tool, if applicable. No additional management support is needed unless otherwise documented below in the visit note. 

## 2016-10-09 NOTE — Patient Instructions (Signed)
Great to see you.  Take cipro as directed- 1 tablet twice daily for 3-4 weeks.   But please call tomorrow if not feeling better.

## 2016-10-09 NOTE — Addendum Note (Signed)
Addended by: Pilar Grammes on: 10/09/2016 01:03 PM   Modules accepted: Orders

## 2016-10-09 NOTE — Assessment & Plan Note (Signed)
With abdominal pain in a man with a h/o prostatitis. UA showed trace blood only. Will treat for prostatitis with cipro. If pain persists, consider imaging to rule out kidney stones.

## 2016-10-09 NOTE — Progress Notes (Signed)
Subjective:   Patient ID: Brandon Farrell, male    DOB: February 27, 1961, 56 y.o.   MRN: [id number]  Brandon Farrell is a pleasant 56 y.o. year old male who presents to clinic today with Abdominal Pain (Lower abdominal pain and trouble with urine stream for 2 days.)  on 10/09/2016  HPI:  Having lower abdominal pain with difficulty urinating, weak stream for past 2 days. Had chills last night.  No fever.  No hematuria.  He is having some back pain today.  PMH significant for prostatitis.    Current Outpatient Prescriptions on File Prior to Visit  Medication Sig Dispense Refill  . atorvastatin (LIPITOR) 20 MG tablet Take 1 tablet (20 mg total) by mouth daily. 90 tablet 3  . Choline Fenofibrate (FENOFIBRIC ACID) 135 MG CPDR TAKE 1 CAPSULE EVERY DAY 90 capsule 1  . diclofenac (VOLTAREN) 75 MG EC tablet Take 1 tablet (75 mg total) by mouth 2 (two) times daily. Spanish labels 60 tablet 3   No current facility-administered medications on file prior to visit.     No Known Allergies  Past Medical History:  Diagnosis Date  . Cholelithiasis   . Hypercholesterolemia   . Hypertension   . Palpitations    a. 03/2012 Ex MV: No isch/infarct;  b. 04/2012 Echo: EF 55-65%, mildly dil LA.  Marland Kitchen Vertigo     Past Surgical History:  Procedure Laterality Date  . [place]   left  . LAPAROSCOPIC CHOLECYSTECTOMY  april 2013    Family History  Problem Relation Age of Onset  . Heart disease Son   . Colon cancer Neg Hx   . Stomach cancer Neg Hx     Social History   Social History  . Marital status: Married    Spouse name: N/A  . Number of children: 2  . Years of education: N/A   Occupational History  . [employer]   .  Self Employed   Social History Main Topics  . Smoking status: Never Smoker  . Smokeless tobacco: Never Used  . Alcohol use 1.8 oz/week    3 Cans of beer per week     Comment: occasional  . Drug use: No  . Sexual activity: Not on file   Other Topics Concern  .  Not on file   Social History Narrative  . No narrative on file   The PMH, PSH, Social History, Family History, Medications, and allergies have been reviewed in [facility], and have been updated if relevant.   Review of Systems  Constitutional: Negative.   Gastrointestinal: Positive for abdominal pain. Negative for abdominal distention, anal bleeding, blood in stool, constipation, diarrhea, nausea, rectal pain and vomiting.  Genitourinary: Positive for decreased urine volume, difficulty urinating and frequency. Negative for discharge, dysuria, enuresis, flank pain, genital sores, hematuria, penile pain, penile swelling, scrotal swelling, testicular pain and urgency.  All other systems reviewed and are negative.      Objective:    BP 102/70 (BP Location: Left Arm, Patient Position: Sitting, Cuff Size: Large)   Pulse 70   Temp 98.4 F (36.9 C) (Oral)   Wt 205 lb (93 kg)   SpO2 95%   BMI 33.09 kg/m    Physical Exam  Constitutional: He is oriented to person, place, and time. He appears well-developed and well-nourished. No distress.  HENT:  Head: Normocephalic and atraumatic.  Eyes: Conjunctivae are normal.  Cardiovascular: Normal rate.   Pulmonary/Chest: Effort normal.  Abdominal: Soft. He exhibits no distension. There  is no tenderness. There is no rebound and no guarding.  Neurological: He is alert and oriented to person, place, and time. No cranial nerve deficit.  Skin: Skin is warm and dry. He is not diaphoretic.  Psychiatric: He has a normal mood and affect. His behavior is normal. Judgment and thought content normal.  Nursing note and vitals reviewed.         Assessment & Plan:   No diagnosis found. No Follow-up on file.

## 2016-10-10 LAB — URINE CULTURE: ORGANISM ID, BACTERIA: NO GROWTH

## 2016-10-31 ENCOUNTER — Encounter: Payer: Self-pay | Admitting: Cardiovascular Disease

## 2016-11-03 ENCOUNTER — Ambulatory Visit: Payer: BLUE CROSS/BLUE SHIELD | Admitting: Family Medicine

## 2016-11-17 ENCOUNTER — Ambulatory Visit (INDEPENDENT_AMBULATORY_CARE_PROVIDER_SITE_OTHER): Payer: BLUE CROSS/BLUE SHIELD | Admitting: Family Medicine

## 2016-11-17 ENCOUNTER — Encounter: Payer: Self-pay | Admitting: Family Medicine

## 2016-11-17 VITALS — BP 100/58 | HR 68 | Temp 98.2°F | Ht 66.0 in | Wt 209.5 lb

## 2016-11-17 DIAGNOSIS — M25562 Pain in left knee: Secondary | ICD-10-CM

## 2016-11-17 NOTE — Progress Notes (Signed)
Pre visit review using our clinic review tool, if applicable. No additional management support is needed unless otherwise documented below in the visit note. 

## 2016-11-17 NOTE — Progress Notes (Signed)
Dr. Frederico Hamman T. Sophia Sperry, MD, [facility] [place], [address] Phone: [phone] Fax: [phone]  11/17/2016  Patient: Brandon Farrell, MRN: [id number], DOB: [birth year], 56 y.o.  Primary Physician:  Arnette Norris, MD   Chief Complaint  Patient presents with  . Follow-up    Left Knee   Subjective:   Brandon Farrell is a 56 y.o. very pleasant male patient who presents with the following:  f/u L knee. S/p intraarticular injection, prior injury coming back from [place] and   Has not been exercising. When walking some pain - overall, he is dramatically improved compared to the last time I saw him. Feeling much better overall.  No giving. No locking up.   Spanish interpreter used.   Past Medical History, Surgical History, Social History, Family History, Problem List, Medications, and Allergies have been reviewed and updated if relevant.  Patient Active Problem List   Diagnosis Date Noted  . Weak urinary stream 10/09/2016  . Skin lesion 07/30/2016  . Abdominal pain, left lower quadrant 03/07/2016  . Hematochezia 03/07/2016  . Hyperlipidemia 10/02/2014  . Left knee pain 08/03/2014  . Dry eye syndrome 08/03/2014  . Acute allergic conjunctivitis of both eyes 08/03/2014  . Palpitations 03/22/2014  . Arthritis 12/22/2013  . Vertigo 03/09/2013  . Routine general medical examination at a health care facility 03/23/2012  . Family history of MI (myocardial infarction) 03/23/2012  . HYPERTRIGLYCERIDEMIA 06/18/2007  . HYPERLIPIDEMIA 06/18/2007  . HYPERGLYCEMIA 06/18/2007  . OBESITY 06/11/2007  . GERD 06/11/2007  . VITILIGO 06/11/2007    Past Medical History:  Diagnosis Date  . Cholelithiasis   . Hypercholesterolemia   . Hypertension   . Palpitations    a. 03/2012 Ex MV: No isch/infarct;  b. 04/2012 Echo: EF 55-65%, mildly dil LA.  Marland Kitchen Vertigo     Past Surgical History:  Procedure Laterality Date  . [place]   left   . LAPAROSCOPIC CHOLECYSTECTOMY  april 2013    Social History   Social History  . Marital status: Married    Spouse name: N/A  . Number of children: 2  . Years of education: N/A   Occupational History  . [employer]   .  Self Employed   Social History Main Topics  . Smoking status: Never Smoker  . Smokeless tobacco: Never Used  . Alcohol use 1.8 oz/week    3 Cans of beer per week     Comment: occasional  . Drug use: No  . Sexual activity: Not on file   Other Topics Concern  . Not on file   Social History Narrative  . No narrative on file    Family History  Problem Relation Age of Onset  . Heart disease Son   . Colon cancer Neg Hx   . Stomach cancer Neg Hx     No Known Allergies  Medication list reviewed and updated in full in [place].  GEN: No fevers, chills. Nontoxic. Primarily MSK c/o today. MSK: Detailed in the HPI GI: tolerating PO intake without difficulty Neuro: No numbness, parasthesias, or tingling associated. Otherwise the pertinent positives of the ROS are noted above.   Objective:   BP (!) 100/58   Pulse 68   Temp 98.2 F (36.8 C) (Oral)   Ht 5\' 6"  (1.676 m)   Wt 209 lb 8 oz (95 kg)   BMI 33.81 kg/m    GEN: WDWN, NAD, Non-toxic, Alert &  Oriented x 3 HEENT: Atraumatic, Normocephalic.  Ears and Nose: No external deformity. EXTR: No clubbing/cyanosis/edema NEURO: Normal gait.  PSYCH: Normally interactive. Conversant. Not depressed or anxious appearing.  Calm demeanor.   Knee:  L Gait: Normal heel toe pattern ROM: 0-125 Effusion: neg Echymosis or edema: none Patellar tendon NT Painful PLICA: neg Patellar grind: negative Medial and lateral patellar facet loading: negative medial and lateral joint lines: mild medial joint line pain Mcmurray's neg Flexion-pinch neg Varus and valgus stress: stable Lachman: neg Ant and Post drawer: neg Hip abduction, IR, ER: WNL Hip flexion str: 5/5 Hip abd: 5/5 Quad: 5/5 VMO  atrophy:No Hamstring concentric and eccentric: 5/5   Radiology: Dg Knee Complete 4 Views Left  Result Date: 03/03/2016 CLINICAL DATA:  Left knee pain following an injury going down stairs 10 days ago. Previous left knee surgery 22 years ago. EXAM: LEFT KNEE - COMPLETE 4+ VIEW COMPARISON:  08/03/2014. FINDINGS: Moderate lateral joint space narrowing and associated spur formation. Mild medial and tibial spine spur formation. Moderate patellofemoral spur formation. No fracture, dislocation or effusion. IMPRESSION: 1. Tricompartmental degenerative changes. 2. No fracture or effusion. Electronically Signed   By: Claudie Revering M.D.   On: 03/03/2016 16:27   Assessment and Plan:   Left knee pain, unspecified chronicity  Looks much better, resume return to play for soccer and activity  Follow-up: No Follow-up on file.  Signed,  Maud Deed. Darrnell Mangiaracina, MD   Allergies as of 11/17/2016   No Known Allergies     Medication List       Accurate as of 11/17/16  1:42 PM. Always use your most recent med list.          atorvastatin 20 MG tablet Commonly known as:  LIPITOR Take 1 tablet (20 mg total) by mouth daily.   diclofenac 75 MG EC tablet Commonly known as:  VOLTAREN Take 1 tablet (75 mg total) by mouth 2 (two) times daily. Spanish labels   Fenofibric Acid 135 MG Cpdr TAKE 1 CAPSULE EVERY DAY

## 2016-11-19 ENCOUNTER — Encounter: Payer: Self-pay | Admitting: Cardiovascular Disease

## 2016-11-19 ENCOUNTER — Ambulatory Visit (INDEPENDENT_AMBULATORY_CARE_PROVIDER_SITE_OTHER): Payer: BLUE CROSS/BLUE SHIELD | Admitting: Cardiovascular Disease

## 2016-11-19 VITALS — BP 114/80 | HR 58 | Ht 66.0 in | Wt 206.1 lb

## 2016-11-19 DIAGNOSIS — E782 Mixed hyperlipidemia: Secondary | ICD-10-CM

## 2016-11-19 NOTE — Patient Instructions (Signed)
Medication Instructions:  Your physician recommends that you continue on your current medications as directed. Please refer to the Current Medication list given to you today.   Labwork: TODAY - cholesterol, complete metabolic panel   Testing/Procedures: None Ordered   Follow-Up: Your physician wants you to follow-up in: 1 year with Dr. Acie Fredrickson.  You will receive a reminder letter in the mail two months in advance. If you don't receive a letter, please call our office to schedule the follow-up appointment.   If you need a refill on your cardiac medications before your next appointment, please call your pharmacy.   Thank you for choosing [facility]! Christen Bame, RN [phone]

## 2016-11-19 NOTE — Progress Notes (Signed)
Cardiology Office Note   Date:  11/19/2016   ID:  Brandon Farrell, Brandon Farrell Dec 13, 1960, MRN [id number]  PCP:  Brandon Norris, MD  Cardiologist:   Brandon Moores, MD   Chief Complaint  Patient presents with  . Hyperlipidemia   1. Hypertriglyceridemia  History of Present Illness:  Brandon Farrell is a 56 yo hispanic gentleman with a hx of hyperlipidemia. He was accompanied by a friend who serves as Brandon Farrell. His son died 43 in his sleep at age 34. He's here today for evaluation and risk reduction. He's not had any cardiac complaints.  Brandon Farrell remains quite active. He plays soccer on regular basis. He's never had episodes of chest pain or shortness breath. He owns Brandon Farrell. He has a history of hyperlipidemia and his lipids have been fairly well controlled.  Dec. 4, 2013: Following his initial consultation in August we performed an echocardiogram and Myoview study. The echo revealed normal left ventricular systolic function. The Myoview revealed no evidence of ischemia and also revealed normal left ventricular systolic function.  He hs dizziness for the past 2 weeks when he goes from a lying position to a sitting position or standing position.   Oct. 1, 2014:  Brandon Farrell presents with some episodes of dizziness. The symptoms were were worse when he would lie back. He has not been playing soccer because of a back issue. He has had a normal echo and a normal myoview study. He has not had any chest pain or dyspnea.   March 22, 2014:  Brandon Farrell is seen today for followup visit. He was seen by Brandon Bayley, PA for some palpitations. He placed to monitor on him. So far, there have not been any significant arrhythmias   October 02, 2014:   Brandon Farrell is a 56 y.o. male who presents for follow up of his syncope. Seen with Brandon Farrell. No further episode of syncope  Not as active this past winter but plays soccor on a regular basis.   04/25/2015:  Pt is seen  with Brandon Farrell, Intrepreter.  Doing ok. Limited by orthopedic injuries , not playing soccer . No CP or dyspnea Was started on Lipitor in March .  Labs in June looked great.   November 13, 2015:  Pt is seen with Brandon Farrell, intrepreter . Doing well  Walking , not much soccer.    November 19, 2016:  Brandon Farrell is seen today .  No CP or dyspnea.  Walking some.   Has a knee injury that keeps him from playing soccer.     Past Medical History:  Diagnosis Date  . Cholelithiasis   . Hypercholesterolemia   . Hypertension   . Palpitations    a. 03/2012 Ex MV: No isch/infarct;  b. 04/2012 Echo: EF 55-65%, mildly dil LA.  Marland Kitchen Vertigo     Past Surgical History:  Procedure Laterality Date  . [place]   left  . LAPAROSCOPIC CHOLECYSTECTOMY  april 2013     Current Outpatient Prescriptions  Medication Sig Dispense Refill  . atorvastatin (LIPITOR) 20 MG tablet Take 1 tablet (20 mg total) by mouth daily. 90 tablet 3  . Choline Fenofibrate (FENOFIBRIC ACID) 135 MG CPDR TAKE 1 CAPSULE EVERY DAY 90 capsule 1   No current facility-administered medications for this visit.     Allergies:   Patient has no known allergies.    Social History:  The patient  reports that he has never smoked. He has never used smokeless tobacco. He  reports that he drinks about 1.8 oz of alcohol per week . He reports that he does not use drugs.   Family History:  The patient's family history includes Heart disease in his son.    ROS:  Please see the history of present illness.    Review of Systems: Constitutional:  denies fever, chills, diaphoresis, appetite change and fatigue.  HEENT: denies photophobia, eye pain, redness, hearing loss, ear pain, congestion, sore throat, rhinorrhea, sneezing, neck pain, neck stiffness and tinnitus.  Respiratory: denies SOB, DOE, cough, chest tightness, and wheezing.  Cardiovascular: denies chest pain, palpitations and leg swelling.  Gastrointestinal: denies  nausea, vomiting, abdominal pain, diarrhea, constipation, blood in stool.  Genitourinary: denies dysuria, urgency, frequency, hematuria, flank pain and difficulty urinating.  Musculoskeletal: denies  myalgias, back pain, joint swelling, arthralgias and gait problem.   Skin: denies pallor, rash and wound.  Neurological: denies dizziness, seizures, syncope, weakness, light-headedness, numbness and headaches.   Hematological: denies adenopathy, easy bruising, personal or family bleeding history.  Psychiatric/ Behavioral: denies suicidal ideation, mood changes, confusion, nervousness, sleep disturbance and agitation.       All other systems are reviewed and negative.    PHYSICAL EXAM: VS:  BP 114/80 (BP Location: Left Arm, Patient Position: Sitting, Cuff Size: Normal)   Pulse (!) 58   Ht 5\' 6"  (1.676 m)   Wt 206 lb 1.9 oz (93.5 kg)   SpO2 96%   BMI 33.27 kg/m  , BMI Body mass index is 33.27 kg/m. GEN: Well nourished, well developed, in no acute distress  HEENT: normal  Neck: no JVD, carotid bruits, or masses Cardiac: RRR; no murmurs, rubs, or gallops,no edema  Respiratory:  clear to auscultation bilaterally, normal work of breathing GI: soft, nontender, nondistended, + BS MS: no deformity or atrophy  Skin: warm and dry, no rash Neuro:  Strength and sensation are intact Psych: normal  EKG:  EKG is ordered today.  Sinus brady at 58.   NS T wave abn.   Recent Labs: 02/04/2016: ALT 30; BUN 15; Creat 1.18; Potassium 4.1; Sodium 138    Lipid Panel    Component Value Date/Time   CHOL 129 02/04/2016 0829   CHOL 127 01/02/2015 0820   TRIG 89 02/04/2016 0829   HDL 35 (L) 02/04/2016 0829   HDL 34 (L) 01/02/2015 0820   CHOLHDL 3.7 02/04/2016 0829   VLDL 18 02/04/2016 0829   LDLCALC 76 02/04/2016 0829   LDLCALC 74 01/02/2015 0820   LDLDIRECT 124.7 03/27/2011 1225      Wt Readings from Last 3 Encounters:  11/19/16 206 lb 1.9 oz (93.5 kg)  11/17/16 209 lb 8 oz (95 kg)  10/09/16  205 lb (93 kg)      Other studies Reviewed: Additional studies/ records that were reviewed today include: lipid levels from Dr. Hulen Farrell visit. Review of the above records demonstrates: persistently elevated chol levels.  EKG was ordered today. Reveals normal sinus rhythm at 62. He has no ST or T wave changes.  ASSESSMENT AND PLAN:  1.  Hyperlipidemia: Royce is doing very well.    Will check labs today  Encouraged him to walk  Continues current medications. I'll see him again in 1 year .   Current medicines are reviewed at length with the patient today.  The patient does not have concerns regarding medicines.  The following changes have been made:  See above   Disposition:   FU with me in 6 months .    Signed, Brandon Farrell  Nahser, MD  11/19/2016 4:04 PM    [facility] [place], [place], [place]  [address] Phone: [phone]; Fax: [phone]

## 2016-11-20 ENCOUNTER — Other Ambulatory Visit: Payer: Self-pay | Admitting: Cardiovascular Disease

## 2016-11-20 LAB — COMPREHENSIVE METABOLIC PANEL
ALBUMIN: 4.6 g/dL (ref 3.5–5.5)
ALK PHOS: 52 IU/L (ref 39–117)
ALT: 31 IU/L (ref 0–44)
AST: 24 IU/L (ref 0–40)
Albumin/Globulin Ratio: 2.3 — ABNORMAL HIGH (ref 1.2–2.2)
BUN / CREAT RATIO: 15 (ref 9–20)
BUN: 15 mg/dL (ref 6–24)
Bilirubin Total: 0.5 mg/dL (ref 0.0–1.2)
CO2: 24 mmol/L (ref 18–29)
CREATININE: 1.03 mg/dL (ref 0.76–1.27)
Calcium: 9.2 mg/dL (ref 8.7–10.2)
Chloride: 102 mmol/L (ref 96–106)
GFR calc non Af Amer: 80 mL/min/{1.73_m2} (ref >59)
GFR, EST AFRICAN AMERICAN: 93 mL/min/{1.73_m2} (ref >59)
GLOBULIN, TOTAL: 2 g/dL (ref 1.5–4.5)
GLUCOSE: 103 mg/dL — AB (ref 65–99)
Potassium: 4.1 mmol/L (ref 3.5–5.2)
SODIUM: 141 mmol/L (ref 134–144)
Total Protein: 6.6 g/dL (ref 6.0–8.5)

## 2016-11-20 LAB — LIPID PANEL
CHOLESTEROL TOTAL: 141 mg/dL (ref 100–199)
Chol/HDL Ratio: 4.3 ratio (ref 0.0–5.0)
HDL: 33 mg/dL — AB (ref >39)
LDL Calculated: 89 mg/dL (ref 0–99)
Triglycerides: 95 mg/dL (ref 0–149)
VLDL CHOLESTEROL CAL: 19 mg/dL (ref 5–40)

## 2017-02-24 ENCOUNTER — Ambulatory Visit (INDEPENDENT_AMBULATORY_CARE_PROVIDER_SITE_OTHER): Payer: BLUE CROSS/BLUE SHIELD | Admitting: Family Medicine

## 2017-02-24 VITALS — BP 128/82 | HR 61 | Ht 66.0 in | Wt 203.0 lb

## 2017-02-24 DIAGNOSIS — E782 Mixed hyperlipidemia: Secondary | ICD-10-CM

## 2017-02-24 DIAGNOSIS — R002 Palpitations: Secondary | ICD-10-CM

## 2017-02-24 DIAGNOSIS — K219 Gastro-esophageal reflux disease without esophagitis: Secondary | ICD-10-CM

## 2017-02-24 LAB — CBC WITH DIFFERENTIAL/PLATELET
BASOS PCT: 0.6 % (ref 0.0–3.0)
Basophils Absolute: 0 10*3/uL (ref 0.0–0.1)
EOS PCT: 2.9 % (ref 0.0–5.0)
Eosinophils Absolute: 0.2 10*3/uL (ref 0.0–0.7)
HEMATOCRIT: 46.9 % (ref 39.0–52.0)
Hemoglobin: 15.7 g/dL (ref 13.0–17.0)
LYMPHS PCT: 33.6 % (ref 12.0–46.0)
Lymphs Abs: 2.2 10*3/uL (ref 0.7–4.0)
MCHC: 33.5 g/dL (ref 30.0–36.0)
MCV: 90.2 fl (ref 78.0–100.0)
MONOS PCT: 7.7 % (ref 3.0–12.0)
Monocytes Absolute: 0.5 10*3/uL (ref 0.1–1.0)
NEUTROS ABS: 3.5 10*3/uL (ref 1.4–7.7)
Neutrophils Relative %: 55.2 % (ref 43.0–77.0)
PLATELETS: 206 10*3/uL (ref 150.0–400.0)
RBC: 5.2 Mil/uL (ref 4.22–5.81)
RDW: 12.9 % (ref 11.5–15.5)
WBC: 6.4 10*3/uL (ref 4.0–10.5)

## 2017-02-24 LAB — COMPREHENSIVE METABOLIC PANEL
ALBUMIN: 4.5 g/dL (ref 3.5–5.2)
ALT: 42 U/L (ref 0–53)
AST: 26 U/L (ref 0–37)
Alkaline Phosphatase: 67 U/L (ref 39–117)
BUN: 14 mg/dL (ref 6–23)
CALCIUM: 9.6 mg/dL (ref 8.4–10.5)
CHLORIDE: 104 meq/L (ref 96–112)
CO2: 25 meq/L (ref 19–32)
Creatinine, Ser: 0.95 mg/dL (ref 0.40–1.50)
GFR: 86.69 mL/min (ref >60.00)
Glucose, Bld: 100 mg/dL — ABNORMAL HIGH (ref 70–99)
Potassium: 4.1 mEq/L (ref 3.5–5.1)
Sodium: 137 mEq/L (ref 135–145)
Total Bilirubin: 0.4 mg/dL (ref 0.2–1.2)
Total Protein: 7.1 g/dL (ref 6.0–8.3)

## 2017-02-24 LAB — LIPID PANEL
CHOL/HDL RATIO: 7
CHOLESTEROL: 187 mg/dL (ref 0–200)
HDL: 27.2 mg/dL — ABNORMAL LOW (ref >39.00)

## 2017-02-24 LAB — TSH: TSH: 3.71 u[IU]/mL (ref 0.35–4.50)

## 2017-02-24 NOTE — Progress Notes (Signed)
Subjective:   Patient ID: Brandon Farrell, male    DOB: 05-02-1961, 56 y.o.   MRN: [id number]  Brandon Farrell is a pleasant 56 y.o. year old male who presents to clinic today with Tachycardia (went ot see a cardiologist in [place] and was advised to stop cholesterol meds, he stopped taking them and he feels better.) and Gastroesophageal Reflux  on 02/24/2017  HPI:  Seen with interpreter  Palpitations-  Has been following up yearly with Dr. Acie Fredrickson, cardiology, given h/o his son dying suddenly in his sleep at age 57.  He did have negative myoview in 02/2011.  Started on Lipitor in 09/2014.  Also taking fenofibrate for high triglycerides.  Has been experiencing palpitations intermittently for months.  While he was in [place], he saw a cardiologist who advised him to stop taking his cholesterol medication- lipitor and fenofibrate. Since stopping them, he feels better.  Has been under a bit more stress at work lately.  Feels the palpitations mostly when he is laying down at night.  Lab Results  Component Value Date   CHOL 141 11/19/2016   HDL 33 (L) 11/19/2016   LDLCALC 89 11/19/2016   LDLDIRECT 124.7 03/27/2011   TRIG 95 11/19/2016   CHOLHDL 4.3 11/19/2016   Lab Results  Component Value Date   TSH 2.960 03/10/2014   Lab Results  Component Value Date   WBC 7.9 11/28/2011   HGB 15.4 11/28/2011   HCT 45.4 11/28/2011   MCV 90 11/28/2011   PLT 197 11/28/2011    Current Outpatient Prescriptions on File Prior to Visit  Medication Sig Dispense Refill  . atorvastatin (LIPITOR) 20 MG tablet TAKE 1 TABLET (20 MG TOTAL) BY MOUTH DAILY. 90 tablet 3  . Choline Fenofibrate (FENOFIBRIC ACID) 135 MG CPDR TAKE 1 CAPSULE EVERY DAY 90 capsule 1   No current facility-administered medications on file prior to visit.     No Known Allergies  Past Medical History:  Diagnosis Date  . Cholelithiasis   . Hypercholesterolemia   . Hypertension   . Palpitations    a. 03/2012 Ex MV: No isch/infarct;   b. 04/2012 Echo: EF 55-65%, mildly dil LA.  Marland Kitchen Vertigo     Past Surgical History:  Procedure Laterality Date  . [place]   left  . LAPAROSCOPIC CHOLECYSTECTOMY  april 2013    Family History  Problem Relation Age of Onset  . Heart disease Son   . Colon cancer Neg Hx   . Stomach cancer Neg Hx     Social History   Social History  . Marital status: Married    Spouse name: N/A  . Number of children: 2  . Years of education: N/A   Occupational History  . [employer]   .  Self Employed   Social History Main Topics  . Smoking status: Never Smoker  . Smokeless tobacco: Never Used  . Alcohol use 1.8 oz/week    3 Cans of beer per week     Comment: occasional  . Drug use: No  . Sexual activity: Not on file   Other Topics Concern  . Not on file   Social History Narrative  . No narrative on file   The PMH, PSH, Social History, Family History, Medications, and allergies have been reviewed in [facility], and have been updated if relevant.    Review of Systems  Respiratory: Negative.   Cardiovascular: Positive for palpitations. Negative for chest pain and leg swelling.  Gastrointestinal: Negative.  Neurological: Negative.   All other systems reviewed and are negative.      Objective:    BP 128/82   Pulse 61   Ht 5\' 6"  (1.676 m)   Wt 203 lb (92.1 kg)   SpO2 98%   BMI 32.77 kg/m    Physical Exam  Constitutional: He is oriented to person, place, and time. He appears well-developed and well-nourished. No distress.  HENT:  Head: Normocephalic and atraumatic.  Eyes: Conjunctivae are normal.  Cardiovascular: Normal rate and regular rhythm.   Pulmonary/Chest: Effort normal and breath sounds normal.  Musculoskeletal: Normal range of motion. He exhibits no edema.  Neurological: He is alert and oriented to person, place, and time. No cranial nerve deficit.  Skin: Skin is warm and dry. He is not diaphoretic.  Psychiatric: He has a normal mood and affect. His  behavior is normal. Judgment and thought content normal.  Nursing note and vitals reviewed.         Assessment & Plan:   Palpitations - Plan: CBC with Differential/Platelet, Comprehensive metabolic panel, TSH, EKG [id number]  HYPERLIPIDEMIA - Plan: Lipid panel  Gastroesophageal reflux disease, esophagitis presence not specified No Follow-up on file.

## 2017-02-24 NOTE — Assessment & Plan Note (Signed)
He would like to recheck lipid panel today and decide which rxs he needs to take. Agree this is a reasonable option- ? With new dietary changes if LDL and TG would be controlled with lipitor alone.

## 2017-02-24 NOTE — Assessment & Plan Note (Addendum)
Resolved. EKG- reassuring, actually consistent with bradycardia, not tachycardia. ? Anxiety Will check labs today to rule out some other possible contributing factors. The patient indicates understanding of these issues and agrees with the plan.

## 2017-02-25 LAB — LDL CHOLESTEROL, DIRECT: LDL DIRECT: 93 mg/dL

## 2017-04-01 ENCOUNTER — Ambulatory Visit (INDEPENDENT_AMBULATORY_CARE_PROVIDER_SITE_OTHER): Payer: BLUE CROSS/BLUE SHIELD | Admitting: Family Medicine

## 2017-04-01 ENCOUNTER — Encounter: Payer: Self-pay | Admitting: Family Medicine

## 2017-04-01 DIAGNOSIS — M25562 Pain in left knee: Secondary | ICD-10-CM | POA: Diagnosis not present

## 2017-04-01 NOTE — Progress Notes (Signed)
Subjective:   Patient ID: Brandon Farrell, male    DOB: 02-11-1961, 56 y.o.   MRN: [id number]  Brandon Farrell is a pleasant 56 y.o. year old male who presents to clinic today with Knee Pain (1 week ago, last seen 5 months ago )  on 04/01/2017  HPI:  Left knee pain-  Remote h/o surgery on this knee in the 90s.  Saw Dr> Copalnd for this on 11/17/16- note reviewed.  Given steroid injection which did help.  Was pain free until 2 weeks ago.  He does play soccer but does not recall an injury.  Pain is lateral- same area it was previously.  No redness or warmth.  Current Outpatient Prescriptions on File Prior to Visit  Medication Sig Dispense Refill  . atorvastatin (LIPITOR) 20 MG tablet TAKE 1 TABLET (20 MG TOTAL) BY MOUTH DAILY. 90 tablet 3  . Choline Fenofibrate (FENOFIBRIC ACID) 135 MG CPDR TAKE 1 CAPSULE EVERY DAY 90 capsule 1   No current facility-administered medications on file prior to visit.     No Known Allergies  Past Medical History:  Diagnosis Date  . Cholelithiasis   . Hypercholesterolemia   . Hypertension   . Palpitations    a. 03/2012 Ex MV: No isch/infarct;  b. 04/2012 Echo: EF 55-65%, mildly dil LA.  Marland Kitchen Vertigo     Past Surgical History:  Procedure Laterality Date  . [place]   left  . LAPAROSCOPIC CHOLECYSTECTOMY  april 2013    Family History  Problem Relation Age of Onset  . Heart disease Son   . Colon cancer Neg Hx   . Stomach cancer Neg Hx     Social History   Social History  . Marital status: Married    Spouse name: N/A  . Number of children: 2  . Years of education: N/A   Occupational History  . [employer]   .  Self Employed   Social History Main Topics  . Smoking status: Never Smoker  . Smokeless tobacco: Never Used  . Alcohol use 1.8 oz/week    3 Cans of beer per week     Comment: occasional  . Drug use: No  . Sexual activity: Not on file   Other Topics Concern  . Not on file   Social History Narrative  . No  narrative on file   The PMH, PSH, Social History, Family History, Medications, and allergies have been reviewed in [facility], and have been updated if relevant.   Review of Systems  Musculoskeletal: Positive for arthralgias.  Skin: Negative.   Neurological: Negative.   All other systems reviewed and are negative.      Objective:    BP 122/78   Pulse 62   Temp 98.2 F (36.8 C) (Oral)   Resp 16   Wt 203 lb (92.1 kg)   SpO2 96%   BMI 32.77 kg/m    Physical Exam  Constitutional: He appears well-developed and well-nourished. No distress.  HENT:  Head: Normocephalic and atraumatic.  Eyes: Conjunctivae are normal.  Cardiovascular: Normal rate.   Pulmonary/Chest: Effort normal.  Musculoskeletal:       Left knee: He exhibits normal range of motion, no swelling, no effusion, no ecchymosis, no erythema, no bony tenderness, normal meniscus and no MCL laxity. Tenderness found.  Neurological: He is alert. No cranial nerve deficit.  Skin: Skin is warm and dry. He is not diaphoretic.  Psychiatric: He has a normal mood and affect. His behavior is normal.  Judgment and thought content normal.  Nursing note and vitals reviewed.         Assessment & Plan:   Left lateral knee pain No Follow-up on file.

## 2017-04-01 NOTE — Assessment & Plan Note (Signed)
Deteriorated. Pt seen with an interpreter. Advised that likely his steroid intraarticular injection has "run it's course." Advised making a follow up with Dr. Lorelei Pont- re: ?another injection. The patient indicates understanding of these issues and agrees with the plan.

## 2017-04-01 NOTE — Patient Instructions (Signed)
Great to see you.  Please make an appointment with Dr. Lorelei Pont on your way out.

## 2017-04-08 ENCOUNTER — Encounter: Payer: Self-pay | Admitting: Family Medicine

## 2017-04-08 ENCOUNTER — Ambulatory Visit (INDEPENDENT_AMBULATORY_CARE_PROVIDER_SITE_OTHER): Payer: BLUE CROSS/BLUE SHIELD | Admitting: Family Medicine

## 2017-04-08 VITALS — BP 118/82 | HR 64 | Temp 97.6°F | Ht 66.0 in | Wt 202.8 lb

## 2017-04-08 DIAGNOSIS — M1712 Unilateral primary osteoarthritis, left knee: Secondary | ICD-10-CM

## 2017-04-08 DIAGNOSIS — Z23 Encounter for immunization: Secondary | ICD-10-CM | POA: Diagnosis not present

## 2017-04-08 MED ORDER — HYLAN G-F 20 48 MG/6ML IX SOSY
48.0000 mg | PREFILLED_SYRINGE | Freq: Once | INTRA_ARTICULAR | Status: AC
  Administered 2017-04-08: 48 mg via INTRA_ARTICULAR

## 2017-04-08 NOTE — Progress Notes (Signed)
Dr. Frederico Hamman T. Lexia Vandevender, MD, [facility] [place], [address] Phone: [phone] Fax: [phone]  04/08/2017  Patient: Brandon Farrell, MRN: [id number], DOB: Sep 13, 1960, 56 y.o.  Primary Physician:  Lucille Passy, MD   Chief Complaint  Patient presents with  . Knee Pain    Left   Subjective:   Brandon Farrell is a 56 y.o. very pleasant male patient who presents with the following:  L knee OA; patient is known well and has done reasonably well with his L knee OA. He does have some quads wasting, but has done OK. Has responded well to steroid injections in the past.   Synvisc-one L knee  Past Medical History, Surgical History, Social History, Family History, Problem List, Medications, and Allergies have been reviewed and updated if relevant.  Patient Active Problem List   Diagnosis Date Noted  . Left lateral knee pain 04/01/2017  . Hyperlipidemia 10/02/2014  . Palpitations 03/22/2014  . Arthritis 12/22/2013  . Family history of MI (myocardial infarction) 03/23/2012  . HYPERTRIGLYCERIDEMIA 06/18/2007  . HYPERLIPIDEMIA 06/18/2007  . OBESITY 06/11/2007  . GERD 06/11/2007  . VITILIGO 06/11/2007    Past Medical History:  Diagnosis Date  . Cholelithiasis   . Hypercholesterolemia   . Hypertension   . Palpitations    a. 03/2012 Ex MV: No isch/infarct;  b. 04/2012 Echo: EF 55-65%, mildly dil LA.  Marland Kitchen Vertigo     Past Surgical History:  Procedure Laterality Date  . [place]   left  . LAPAROSCOPIC CHOLECYSTECTOMY  april 2013    Social History   Social History  . Marital status: Married    Spouse name: N/A  . Number of children: 2  . Years of education: N/A   Occupational History  . [employer]   .  Self Employed   Social History Main Topics  . Smoking status: Never Smoker  . Smokeless tobacco: Never Used  . Alcohol use 1.8 oz/week    3 Cans of beer per week     Comment: occasional  . Drug  use: No  . Sexual activity: Not on file   Other Topics Concern  . Not on file   Social History Narrative  . No narrative on file    Family History  Problem Relation Age of Onset  . Heart disease Son   . Colon cancer Neg Hx   . Stomach cancer Neg Hx     No Known Allergies  Medication list reviewed and updated in full in [facility].  GEN: No fevers, chills. Nontoxic. Primarily MSK c/o today. MSK: Detailed in the HPI GI: tolerating PO intake without difficulty Neuro: No numbness, parasthesias, or tingling associated. Otherwise the pertinent positives of the ROS are noted above.   Objective:   BP 118/82   Pulse 64   Temp 97.6 F (36.4 C) (Oral)   Ht 5\' 6"  (1.676 m)   Wt 202 lb 12 oz (92 kg)   BMI 32.72 kg/m    GEN: WDWN, NAD, Non-toxic, Alert & Oriented x 3 HEENT: Atraumatic, Normocephalic.  Ears and Nose: No external deformity. EXTR: No clubbing/cyanosis/edema NEURO: Normal gait.  PSYCH: Normally interactive. Conversant. Not depressed or anxious appearing.  Calm demeanor.   Knee:  L Gait: Normal heel toe pattern ROM: lacks 2 deg ext, flexion to 115 Effusion: neg Echymosis or edema: none Patellar tendon NT Painful PLICA: neg Patellar grind: negative Medial and lateral patellar  facet loading: negative medial and lateral joint lines: medial and lateral pain Mcmurray's neg Flexion-pinch neg Varus and valgus stress: stable Lachman: neg Ant and Post drawer: neg Hip abduction, IR, ER: WNL Hip flexion str: 5/5 Hip abd: 5/5 Quad: 5/5 VMO atrophy: mild Hamstring concentric and eccentric: 5/5   Radiology: No results found.  Assessment and Plan:   Primary osteoarthritis of left knee - Plan: Hylan SOSY 48 mg  Need for influenza vaccination - Plan: Flu Vaccine QUAD 36+ mos IM  Reviewed quads str - gave rehab program.  Cont with rehab program Trial of synvisc injection.  Knee Injection: Synvisc-One, L Patient verbally consented to procedure. Risks  (including infection), benefits, and alternatives explained. Sterilely prepped with Chloraprep. Ethyl cholride used for anesthesia, then 7 cc of Lidocaine 1% used for anesthesia in the anterolateral position. Reprepped with Chloraprep.  Anterolateral approach used to inject joint without difficulty, injected with Synvisc-One, 6 mL. No complications with procedure and tolerated well.   Follow-up: No Follow-up on file.  Meds ordered this encounter  Medications  . Hylan SOSY 48 mg   There are no discontinued medications. Orders Placed This Encounter  Procedures  . Flu Vaccine QUAD 36+ mos IM    Signed,  Rahul Malinak T. Chason Mciver, MD   Patient's Medications  New Prescriptions   No medications on file  Previous Medications   ATORVASTATIN (LIPITOR) 20 MG TABLET    TAKE 1 TABLET (20 MG TOTAL) BY MOUTH DAILY.   CHOLINE FENOFIBRATE (FENOFIBRIC ACID) 135 MG CPDR    TAKE 1 CAPSULE EVERY DAY   IBUPROFEN (ADVIL) 200 MG CAPS    Take by mouth.  Modified Medications   No medications on file  Discontinued Medications   No medications on file

## 2017-05-14 ENCOUNTER — Telehealth: Payer: Self-pay | Admitting: Family Medicine

## 2017-05-14 NOTE — Telephone Encounter (Signed)
Pt has appt with Dr Deborra Medina on 05/18/17 at 9 AM.

## 2017-05-14 NOTE — Telephone Encounter (Signed)
[facility]  Patient Name: HANZ WINTERHALTER  DOB: 1961/04/12    Initial Comment Caller states, her father 2 years ago he had shingles, no rash yet and he feels likes it coming back. Verified    Nurse Assessment  Nurse: Harlow Mares, RN, Suanne Marker Date/Time (Eastern Time): 05/14/2017 11:13:42 AM  Confirm and document reason for call. If symptomatic, describe symptoms. ---Caller states, her father 2 years ago he had shingles, no rash yet and he feels likes it coming back. The rash was on his right side under his breast area around to the back, this is where he has the neuralgia feeling once again.  Does the patient have any new or worsening symptoms? ---Yes  Will a triage be completed? ---Yes  Related visit to physician within the last 2 weeks? ---No  Does the PT have any chronic conditions? (i.e. diabetes, asthma, etc.) ---Yes  List chronic conditions. ---hx shingles;  Is this a behavioral health or substance abuse call? ---No     Guidelines    Guideline Title Affirmed Question Affirmed Notes  Shingles Pain persisting > 1 month after rash disappears    Final Disposition User   See PCP within 2 Tillman Abide, RN, Suanne Marker    Comments  Appt scheduled for Monday 08/18/16 with Dr. Deborra Medina at the [facility] office at 9am/.   Referrals  REFERRED TO PCP OFFICE   Caller Disagree/Comply Comply  Caller Understands Yes  PreDisposition Did not know what to do

## 2017-05-18 ENCOUNTER — Encounter: Payer: Self-pay | Admitting: Family Medicine

## 2017-05-18 ENCOUNTER — Ambulatory Visit (INDEPENDENT_AMBULATORY_CARE_PROVIDER_SITE_OTHER): Payer: BLUE CROSS/BLUE SHIELD | Admitting: Family Medicine

## 2017-05-18 DIAGNOSIS — B0229 Other postherpetic nervous system involvement: Secondary | ICD-10-CM | POA: Diagnosis not present

## 2017-05-18 NOTE — Progress Notes (Signed)
Subjective:   Patient ID: Brandon Farrell, male    DOB: 07-03-1961, 56 y.o.   MRN: [id number]  Brandon Farrell is a pleasant 56 y.o. year old male who presents to clinic today with Skin Problem (Patient is here today C/O a skin sensation similar to the feeling when Shingles started a year ago. The sensation presented 1 month ago right side of his chest. No visible skin changes.)  on 05/18/2017  HPI:  Pt seen with interpreter.  Skin sensation- seems similar to what he felt when he had shingles 2 years ago.  Sensation is on the right side of his chest (where he had shingles).  Has been present for about a month or more- comes and goes.  Pain lasts for seconds and resolves.  No visible rash.  No pain.     Current Outpatient Prescriptions on File Prior to Visit  Medication Sig Dispense Refill  . atorvastatin (LIPITOR) 20 MG tablet TAKE 1 TABLET (20 MG TOTAL) BY MOUTH DAILY. 90 tablet 3  . Ibuprofen (ADVIL) 200 MG CAPS Take by mouth.     No current facility-administered medications on file prior to visit.     No Known Allergies  Past Medical History:  Diagnosis Date  . Cholelithiasis   . Hypercholesterolemia   . Hypertension   . Palpitations    a. 03/2012 Ex MV: No isch/infarct;  b. 04/2012 Echo: EF 55-65%, mildly dil LA.  Marland Kitchen Vertigo     Past Surgical History:  Procedure Laterality Date  . [place]   left  . LAPAROSCOPIC CHOLECYSTECTOMY  april 2013    Family History  Problem Relation Age of Onset  . Heart disease Son   . Colon cancer Neg Hx   . Stomach cancer Neg Hx     Social History   Social History  . Marital status: Married    Spouse name: N/A  . Number of children: 2  . Years of education: N/A   Occupational History  . [employer]   .  Self Employed   Social History Main Topics  . Smoking status: Never Smoker  . Smokeless tobacco: Never Used  . Alcohol use 1.8 oz/week    3 Cans of beer per week     Comment: occasional  . Drug use: No  .  Sexual activity: Not on file   Other Topics Concern  . Not on file   Social History Narrative  . No narrative on file   The PMH, PSH, Social History, Family History, Medications, and allergies have been reviewed in [facility], and have been updated if relevant.   Review of Systems  Constitutional: Negative.   Skin: Negative.   Neurological: Positive for numbness.  All other systems reviewed and are negative.      Objective:    BP 126/64 (BP Location: Left Arm, Patient Position: Sitting, Cuff Size: Normal)   Pulse 60   Temp 98.3 F (36.8 C) (Oral)   Ht 5\' 6"  (1.676 m)   Wt 204 lb 6.4 oz (92.7 kg)   SpO2 97%   BMI 32.99 kg/m    Physical Exam  Constitutional: He is oriented to person, place, and time. He appears well-developed and well-nourished. No distress.  HENT:  Head: Normocephalic and atraumatic.  Eyes: Conjunctivae are normal.  Cardiovascular: Normal rate.   Pulmonary/Chest: Effort normal.  Musculoskeletal: Normal range of motion.  Neurological: He is alert and oriented to person, place, and time.  Skin: Skin is warm and dry.  No rash noted. He is not diaphoretic.  Psychiatric: He has a normal mood and affect. His behavior is normal. Judgment and thought content normal.  Nursing note and vitals reviewed.         Assessment & Plan:   No diagnosis found. No Follow-up on file.

## 2017-05-18 NOTE — Assessment & Plan Note (Signed)
Discussed PHN and course and tx. He prefers no rx at this time. See AVS- Call or return to clinic prn if these symptoms worsen or fail to improve as anticipated.

## 2017-05-18 NOTE — Patient Instructions (Signed)
Neuralgia postherptica (Postherpetic Neuralgia) La neuralgia postherptica (NPH) es un dolor neural que aparece despus de tener culebrilla. La culebrilla es una erupcin cutnea dolorosa que aparece en un lado del cuerpo, generalmente en el tronco o el rostro. Es causada por el virus de la varicela zoster, el mismo virus que causa la varicela. En las personas que tuvieron varicela, el virus puede reaparecer aos ms tarde y causar culebrilla. Puede tener neuralgia postherptica si sigue teniendo dolor durante 3meses despus de la desaparicin de la erupcin de la culebrilla. La neuralgia postherptica aparece en la misma zona donde se produjo la erupcin cutnea de la culebrilla, y, en la mayora de los casos, desaparece en el trmino de 1ao. La aplicacin de la vacuna contra la culebrilla puede prevenir la neuralgia postherptica. Se recomienda que las personas mayores de 50aos se apliquen esta vacuna, la cual puede prevenir la culebrilla y, adems, reducir el riesgo de neuralgia postherptica si tiene culebrilla. CAUSAS La neuralgia postherptica se debe al dao neural que causa el virus de la varicela zoster. Este dao hipersensibiliza los nervios. FACTORES DE RIESGO El envejecimiento es el principal factor de riesgo de desarrollar neuralgia postherptica. La mayora de las personas que padecen este trastorno son mayores de 60aos. Otros factores de riesgo son:  Sentir un dolor muy intenso antes de que aparezca la erupcin cutnea de la culebrilla.  Tener una erupcin cutnea muy extensa.  Tener culebrilla en el nervio que inerva el rostro y el ojo (nervio trigmino). SIGNOS Y SNTOMAS El dolor es el sntoma principal de neuralgia postherptica. Suele ser muy intenso y puede describirse como un dolor punzante, que provoca sensacin de quemazn o que se parece a una descarga elctrica. El dolor puede ser intermitente o permanente. Los roces suaves sobre la piel o los cambios de temperatura  pueden desencadenar el dolor. Junto con el dolor, puede tener picazn. DIAGNSTICO El mdico puede diagnosticar la neuralgia postherptica en funcin de los sntomas y de los antecedentes de culebrilla. Generalmente, no es necesario realizar anlisis de laboratorio ni otras pruebas diagnsticas. TRATAMIENTO No hay cura para la neuralgia postherptica. El tratamiento se centrar en el alivio del dolor. Generalmente, los analgsicos de venta libre no alivian el dolor que provoca la neuralgia postherptica. Tal vez deba consultar a un especialista en dolor. El tratamiento puede incluir:  Antidepresivos para ayudar con el dolor y mejorar el sueo.  Anticonvulsivos para aliviar el dolor neural.  Analgsicos fuertes (opioides).  Un parche anestsico que se coloca sobre la piel (parche de lidocana). INSTRUCCIONES PARA EL CUIDADO EN EL HOGAR Recuperarse de la neuralgia postherptica puede llevar mucho tiempo. Trabaje en estrecha colaboracin con el mdico y procure tener un buen sistema de apoyo en su casa.  Tome todos los medicamentos como se lo haya indicado el mdico.  Use ropa cmoda y suelta.  Cubra las zonas sensibles con una venda para reducir la friccin de las prendas contra la zona.  Si el fro no empeora el dolor, intente aplicarse una compresa fra o una bolsa con gel para bajar la temperatura en la zona.  Hable con el mdico si est deprimido o desesperado. Convivir con un dolor crnico puede causar depresin. SOLICITE ATENCIN MDICA SI:  El medicamento no le calma el dolor.  Tiene dificultades para controlar el dolor en su casa. Esta informacin no tiene como fin reemplazar el consejo del mdico. Asegrese de hacerle al mdico cualquier pregunta que tenga. Document Released: 10/30/2008 Document Revised: 08/04/2014 Document Reviewed: 07/05/2013 Elsevier Interactive Patient   Education  2018 Elsevier Inc.  

## 2017-07-24 ENCOUNTER — Telehealth: Payer: Self-pay | Admitting: Cardiovascular Disease

## 2017-07-24 MED ORDER — ATORVASTATIN CALCIUM 20 MG PO TABS: 20.0000 mg | ORAL_TABLET | Freq: Every day | ORAL | 1 refills | Status: DC

## 2017-07-24 NOTE — Telephone Encounter (Signed)
Spoke with patient's daughter, Mickel Baas who is listed on patient's DPR, regarding patient's medications. She is asking if patient is supposed to be taking atorvastatin 20 mg. I advised that he is supposed to be on this medication. She states the patient was prescribed fenofibrate by Dr. Deborra Medina, PCP, and he thinks they treat the same thing so he has stopped one of them; she is not certain which one he has stopped. I advised her that based on his most recent lab work, he should continue both medications since he has both hypertriglyceridemia and hyperlipidemia. She states she thinks the atorvastatin Rx has expired. I advised I will send a new Rx and advised her to call back in 2-3 weeks to schedule patient's appointment in April with Dr. Acie Fredrickson.  She verbalized understanding and agreement and thanked me for the call.

## 2017-07-24 NOTE — Telephone Encounter (Signed)
New Message   Patients daughter Mickel Baas is calling on behalf of father. She is needing to know what medications should her father be taken. He has stopped taken a pill not sure what he should be taken. Please call.

## 2017-09-24 ENCOUNTER — Telehealth: Payer: Self-pay | Admitting: Family Medicine

## 2017-09-24 DIAGNOSIS — Z1211 Encounter for screening for malignant neoplasm of colon: Secondary | ICD-10-CM

## 2017-09-24 NOTE — Telephone Encounter (Signed)
Copied from [place]. Topic: Referral >> Sep 24, 2017  8:36 AM Boyd Kerbs wrote: CRM for notification. See Telephone encounter for:  Asking for Referral for colonoscopy.   09/24/17.

## 2017-09-24 NOTE — Telephone Encounter (Signed)
Yes okay to place referral.  Thank you.

## 2017-09-24 NOTE — Telephone Encounter (Signed)
Copied from [place]. Topic: Referral - Request >> Sep 24, 2017  8:44 AM Boyd Kerbs wrote:  Reason for CRM: pt. Is asking for referral for a colonoscopy

## 2017-09-24 NOTE — Telephone Encounter (Signed)
Referral entered  

## 2017-09-24 NOTE — Telephone Encounter (Signed)
Patient is due for colonoscopy, okay to place referral?

## 2017-10-01 ENCOUNTER — Telehealth: Payer: Self-pay | Admitting: Family Medicine

## 2017-10-02 MED ORDER — FENOFIBRIC ACID 135 MG PO CPDR: 1.0000 | DELAYED_RELEASE_CAPSULE | Freq: Every day | ORAL | 1 refills | Status: DC

## 2017-10-02 NOTE — Telephone Encounter (Signed)
He should be taking it unless it was d/c'd by cardiology.  Okay to refill as requested.

## 2017-10-02 NOTE — Telephone Encounter (Signed)
Pt's daughter called to ask why the  Choline Fenofibrate (FENOFIBRIC ACID) 135 MG  Was denied.  Pt does not know he is not supposed to be taking.  The pharmacy told pt he is no longer supposed to take this.  [id number] [address], [place] (Phone) [phone] (Fax)

## 2017-10-02 NOTE — Addendum Note (Signed)
Addended by: Kateri Mc E on: 10/02/2017 10:35 AM   Modules accepted: Orders

## 2017-10-02 NOTE — Telephone Encounter (Signed)
Please advise - per last OV, this medication was taken off medication list(patient preference)

## 2017-10-02 NOTE — Telephone Encounter (Signed)
Rx sent in

## 2017-10-23 ENCOUNTER — Telehealth: Payer: Self-pay

## 2017-10-23 ENCOUNTER — Encounter: Payer: Self-pay | Admitting: Gastroenterology

## 2017-10-23 NOTE — Telephone Encounter (Signed)
I called and gave daughter number to [facility]/I advised her that they had left him a message on 3.5.19 to RTC to schedule/she apologized as they are bad about listening to their messages/she will call them and schedule/thx dmf  Copied from [place]. Topic: Referral - Request >> Sep 24, 2017  8:44 AM Boyd Kerbs wrote: Reason for CRM: pt. Is asking for referral for a colonoscopy  >> Oct 23, 2017 10:16 AM Neva Seat wrote: Pt's daughter called back after 2 weeks to check on the status of pt referral request.   Please call daughter... Norvil Martensen - daughter - [phone]

## 2017-11-20 ENCOUNTER — Other Ambulatory Visit: Payer: Self-pay

## 2017-11-20 ENCOUNTER — Ambulatory Visit (AMBULATORY_SURGERY_CENTER): Payer: Self-pay | Admitting: *Deleted

## 2017-11-20 VITALS — Ht 62.0 in | Wt 202.2 lb

## 2017-11-20 DIAGNOSIS — Z1211 Encounter for screening for malignant neoplasm of colon: Secondary | ICD-10-CM

## 2017-11-20 MED ORDER — NA SULFATE-K SULFATE-MG SULF 17.5-3.13-1.6 GM/177ML PO SOLN: ORAL | 0 refills | Status: DC

## 2017-11-20 NOTE — Progress Notes (Signed)
No egg or soy allergy  No diet medications taken  No home oxygen or hx of sleep apnea  No anesthesia or intubation problems  Mariel, interpreter, in here to translate  PNM than $50 for Suprep coupon given

## 2017-11-30 ENCOUNTER — Ambulatory Visit: Payer: BLUE CROSS/BLUE SHIELD | Admitting: Family Medicine

## 2017-11-30 ENCOUNTER — Encounter: Payer: Self-pay | Admitting: Family Medicine

## 2017-11-30 ENCOUNTER — Telehealth: Payer: Self-pay | Admitting: Gastroenterology

## 2017-11-30 DIAGNOSIS — R1310 Dysphagia, unspecified: Secondary | ICD-10-CM | POA: Diagnosis not present

## 2017-11-30 MED ORDER — OMEPRAZOLE 20 MG PO CPDR: 20.0000 mg | DELAYED_RELEASE_CAPSULE | Freq: Every day | ORAL | 3 refills | Status: DC

## 2017-11-30 NOTE — Progress Notes (Signed)
Subjective:   Patient ID: Brandon Farrell, male    DOB: [birth year], 57 y.o.   MRN: [id number]  Brandon Farrell is a pleasant 56 y.o. year old male who presents to clinic today with Dysphagia (Patient is here today C/O problems swallowing.  According to the chart he is scheduled with [facility] on 5.16.19 with Dr. Fuller Plan for a Colonoscopy.  About 8 days this has been going on and started with heartburn.  He is not currently on any medication for this and has not tried anything OTC.)  on 11/30/2017  HPI:  Here with interpreter.  For over a week, progressive symptoms of feeling like saliva, food and liquid is getting stuck. Also having more reflux symptoms. No abdominal pain, nausea or vomiting. No black or bloody stools.  Of note, scheduled to have a colonoscopy with Dr. Fuller Plan on 12/10/17.  Current Outpatient Medications on File Prior to Visit  Medication Sig Dispense Refill  . atorvastatin (LIPITOR) 20 MG tablet Take 1 tablet (20 mg total) by mouth daily. 90 tablet 1  . Choline Fenofibrate (FENOFIBRIC ACID) 135 MG CPDR Take 1 capsule by mouth daily. 90 capsule 1  . Ibuprofen (ADVIL) 200 MG CAPS Take by mouth as needed.     . Na Sulfate-K Sulfate-Mg Sulf (SUPREP BOWEL PREP KIT) 17.5-3.13-1.6 GM/177ML SOLN Suprep as directed, no substitutions (Patient not taking: Reported on 11/30/2017) 354 mL 0   No current facility-administered medications on file prior to visit.     No Known Allergies  Past Medical History:  Diagnosis Date  . Arthritis    left knee  . Cataract   . Cholelithiasis   . Hypercholesterolemia   . Hypertension    no per pt  . Palpitations    a. 03/2012 Ex MV: No isch/infarct;  b. 04/2012 Echo: EF 55-65%, mildly dil LA.  Marland Kitchen Vertigo     Past Surgical History:  Procedure Laterality Date  . [place]   left  . LAPAROSCOPIC CHOLECYSTECTOMY  april 2013    Family History  Problem Relation Age of Onset  . Heart disease Son   . Colon cancer Neg Hx   . Stomach  cancer Neg Hx   . Esophageal cancer Neg Hx   . Rectal cancer Neg Hx     Social History   Socioeconomic History  . Marital status: Married    Spouse name: Not on file  . Number of children: 2  . Years of education: Not on file  . Highest education level: Not on file  Occupational History  . Occupation: [employer]    Employer: Self Employed  Social Needs  . Financial resource strain: Not on file  . Food insecurity:    Worry: Not on file    Inability: Not on file  . Transportation needs:    Medical: Not on file    Non-medical: Not on file  Tobacco Use  . Smoking status: Never Smoker  . Smokeless tobacco: Never Used  Substance and Sexual Activity  . Alcohol use: Yes    Alcohol/week: 1.8 oz    Types: 3 Cans of beer per week    Comment: occasional  . Drug use: No  . Sexual activity: Not on file  Lifestyle  . Physical activity:    Days per week: Not on file    Minutes per session: Not on file  . Stress: Not on file  Relationships  . Social connections:    Talks on phone: Not on file  Gets together: Not on file    Attends religious service: Not on file    Active member of club or organization: Not on file    Attends meetings of clubs or organizations: Not on file    Relationship status: Not on file  . Intimate partner violence:    Fear of current or ex partner: Not on file    Emotionally abused: Not on file    Physically abused: Not on file    Forced sexual activity: Not on file  Other Topics Concern  . Not on file  Social History Narrative  . Not on file   The PMH, PSH, Social History, Family History, Medications, and allergies have been reviewed in [facility], and have been updated if relevant.   Review of Systems  Gastrointestinal: Negative for abdominal distention, abdominal pain, anal bleeding, blood in stool, constipation, diarrhea, nausea, rectal pain and vomiting.  All other systems reviewed and are negative.      Objective:    BP 126/74 (BP  Location: Left Arm, Patient Position: Sitting, Cuff Size: Normal)   Pulse 66   Temp 98.8 F (37.1 C) (Oral)   Ht 5' 3.78" (1.62 m)   Wt 202 lb 12.8 oz (92 kg)   SpO2 95%   BMI 35.05 kg/m    Physical Exam  Constitutional: He is oriented to person, place, and time. He appears well-developed and well-nourished. No distress.  HENT:  Head: Normocephalic.  Eyes: EOM are normal.  Neck: Normal range of motion.  Cardiovascular: Normal rate.  Pulmonary/Chest: Effort normal.  Abdominal: Soft. Bowel sounds are normal. He exhibits no distension. There is no tenderness.  Neurological: He is alert and oriented to person, place, and time.  Skin: Skin is warm. He is not diaphoretic.  Psychiatric: He has a normal mood and affect. His behavior is normal. Judgment and thought content normal.  Nursing note and vitals reviewed.         Assessment & Plan:   Dysphagia, unspecified type No follow-ups on file.

## 2017-11-30 NOTE — Telephone Encounter (Signed)
Dr Fuller Plan, the patient was seen for "difficulty swallowing" by Dr Deborra Medina today. He has been started on Omeprazole today. See the office note.  He has a screening colonoscopy 11/30/17. Dr Deborra Medina is asking if an EGD can be done that day also. He is concerned about a stricture. Symptoms started out as heartburn. Thank you

## 2017-11-30 NOTE — Telephone Encounter (Signed)
My schedule is full on 5/16 however I will add on an EGD if [facility] approves. If she does not approve then we can schedule the EGD on another day or schedule another day for colon/egd when I have the [place] time available.

## 2017-11-30 NOTE — Assessment & Plan Note (Signed)
Concerning symptoms of esophageal stricture. Scheduled for colonoscopy on 12/10/17- ? If GI can perform endoscopy as well.  Will ask Dr.Stark to consider this. Start on omeprazole 20 mg daily. The patient indicates understanding of these issues and agrees with the plan.

## 2017-11-30 NOTE — Telephone Encounter (Signed)
Contacted the daughter who was expecting my call. The patient will expect to have the colon and an EGD. He will follow the instructions he was given for the prep. No changes.

## 2017-12-10 ENCOUNTER — Ambulatory Visit (AMBULATORY_SURGERY_CENTER): Payer: BLUE CROSS/BLUE SHIELD | Admitting: Gastroenterology

## 2017-12-10 ENCOUNTER — Other Ambulatory Visit: Payer: Self-pay

## 2017-12-10 ENCOUNTER — Encounter: Payer: Self-pay | Admitting: Gastroenterology

## 2017-12-10 VITALS — BP 109/69 | HR 56 | Temp 98.2°F | Resp 14 | Ht 63.0 in | Wt 202.0 lb

## 2017-12-10 DIAGNOSIS — D120 Benign neoplasm of cecum: Secondary | ICD-10-CM | POA: Diagnosis not present

## 2017-12-10 DIAGNOSIS — D126 Benign neoplasm of colon, unspecified: Secondary | ICD-10-CM | POA: Diagnosis not present

## 2017-12-10 DIAGNOSIS — D125 Benign neoplasm of sigmoid colon: Secondary | ICD-10-CM

## 2017-12-10 DIAGNOSIS — Z1211 Encounter for screening for malignant neoplasm of colon: Secondary | ICD-10-CM | POA: Diagnosis not present

## 2017-12-10 DIAGNOSIS — K219 Gastro-esophageal reflux disease without esophagitis: Secondary | ICD-10-CM | POA: Diagnosis not present

## 2017-12-10 DIAGNOSIS — K635 Polyp of colon: Secondary | ICD-10-CM

## 2017-12-10 DIAGNOSIS — R1310 Dysphagia, unspecified: Secondary | ICD-10-CM

## 2017-12-10 MED ORDER — SODIUM CHLORIDE 0.9 % IV SOLN
500.0000 mL | Freq: Once | INTRAVENOUS | Status: DC
Start: 2017-12-10 — End: 2018-08-17

## 2017-12-10 NOTE — Progress Notes (Signed)
Called to room to assist during endoscopic procedure.  Patient ID and intended procedure confirmed with present staff. Received instructions for my participation in the procedure from the performing physician.  

## 2017-12-10 NOTE — Progress Notes (Signed)
Pt's states no medical or surgical changes since previsit or office visit. 

## 2017-12-10 NOTE — Patient Instructions (Signed)
  HANDOUTS GIVEN : POST ESOPHAGEAL DILATATION, GASTRITIS,HEMORRHOIDS, DIVERTICULOSIS AND POLYPS.   YOU HAD AN ENDOSCOPIC PROCEDURE TODAY AT THE [facility]:   Refer to the procedure report that was given to you for any specific questions about what was found during the examination.  If the procedure report does not answer your questions, please call your gastroenterologist to clarify.  If you requested that your care partner not be given the details of your procedure findings, then the procedure report has been included in a sealed envelope for you to review at your convenience later.  YOU SHOULD EXPECT: Some feelings of bloating in the abdomen. Passage of more gas than usual.  Walking can help get rid of the air that was put into your GI tract during the procedure and reduce the bloating. If you had a lower endoscopy (such as a colonoscopy or flexible sigmoidoscopy) you may notice spotting of blood in your stool or on the toilet paper. If you underwent a bowel prep for your procedure, you may not have a normal bowel movement for a few days.  Please Note:  You might notice some irritation and congestion in your nose or some drainage.  This is from the oxygen used during your procedure.  There is no need for concern and it should clear up in a day or so.  SYMPTOMS TO REPORT IMMEDIATELY:   Following lower endoscopy (colonoscopy or flexible sigmoidoscopy):  Excessive amounts of blood in the stool  Significant tenderness or worsening of abdominal pains  Swelling of the abdomen that is new, acute  Fever of 100F or higher   Following upper endoscopy (EGD)  Vomiting of blood or coffee ground material  New chest pain or pain under the shoulder blades  Painful or persistently difficult swallowing  New shortness of breath  Fever of 100F or higher  Black, tarry-looking stools  For urgent or emergent issues, a gastroenterologist can be reached at any hour by calling [phone]  [phone].   DIET:  CLEAR LIQUIDS FOR 2 HOURS, UNTIL 12;00. AFTER 12:00 ADVANCE TO A SOFT DIET. RESUME YOUR REGULAR DIET IN THE AM.  ACTIVITY:  You should plan to take it easy for the rest of today and you should NOT DRIVE or use heavy machinery until tomorrow (because of the sedation medicines used during the test).    FOLLOW UP: Our staff will call the number listed on your records the next business day following your procedure to check on you and address any questions or concerns that you may have regarding the information given to you following your procedure. If we do not reach you, we will leave a message.  However, if you are feeling well and you are not experiencing any problems, there is no need to return our call.  We will assume that you have returned to your regular daily activities without incident.  If any biopsies were taken you will be contacted by phone or by letter within the next 1-3 weeks.  Please call us at [phone] if you have not heard about the biopsies in 3 weeks.    SIGNATURES/CONFIDENTIALITY: You and/or your care partner have signed paperwork which will be entered into your electronic medical record.  These signatures attest to the fact that that the information above on your After Visit Summary has been reviewed and is understood.  Full responsibility of the confidentiality of this discharge information lies with you and/or your care-partner.

## 2017-12-10 NOTE — Op Note (Signed)
[place] Patient Name: Brandon Farrell Procedure Date: 12/10/2017 9:21 AM MRN: [id number] Endoscopist: Ladene Artist , MD Age: 57 Referring MD:  Date of Birth: 06-18-1961 Gender: Male Account #: [id number] Procedure:                Colonoscopy Indications:              Screening for colorectal malignant neoplasm Medicines:                Monitored Anesthesia Care Procedure:                Pre-Anesthesia Assessment:                           - Prior to the procedure, a History and Physical                            was performed, and patient medications and                            allergies were reviewed. The patient's tolerance of                            previous anesthesia was also reviewed. The risks                            and benefits of the procedure and the sedation                            options and risks were discussed with the patient.                            All questions were answered, and informed consent                            was obtained. Prior Anticoagulants: The patient has                            taken no previous anticoagulant or antiplatelet                            agents. ASA Grade Assessment: II - A patient with                            mild systemic disease. After reviewing the risks                            and benefits, the patient was deemed in                            satisfactory condition to undergo the procedure.                           After obtaining informed consent, the colonoscope  was passed under direct vision. Throughout the                            procedure, the patient's blood pressure, pulse, and                            oxygen saturations were monitored continuously. The                            Model PCF-[id number]) scope was introduced                            through the anus and advanced to the the cecum,                            identified by  appendiceal orifice and ileocecal                            valve. The ileocecal valve, appendiceal orifice,                            and rectum were photographed. The quality of the                            bowel preparation was good. The colonoscopy was                            performed without difficulty. The patient tolerated                            the procedure well. Scope In: 9:26:56 AM Scope Out: 9:41:44 AM Scope Withdrawal Time: 0 hours 12 minutes 45 seconds  Total Procedure Duration: 0 hours 14 minutes 48 seconds  Findings:                 The perianal and digital rectal examinations were                            normal.                           A 4 mm polyp was found in the cecum. The polyp was                            sessile. The polyp was removed with a cold biopsy                            forceps. Resection and retrieval were complete.                           Three sessile polyps were found in the sigmoid                            colon. The polyps were 6 to 8 mm in size.  These                            polyps were removed with a cold snare. Resection                            and retrieval were complete.                           Many medium-mouthed diverticula were found in the                            left colon. There was no evidence of diverticular                            bleeding.                           Internal hemorrhoids were found during                            retroflexion. The hemorrhoids were small and Grade                            I (internal hemorrhoids that do not prolapse).                           The exam was otherwise without abnormality on                            direct and retroflexion views. Complications:            No immediate complications. Estimated blood loss:                            None. Estimated Blood Loss:     Estimated blood loss: none. Impression:               - One 4 mm polyp in the cecum,  removed with a cold                            biopsy forceps. Resected and retrieved.                           - Three 6 to 8 mm polyps in the sigmoid colon,                            removed with a cold snare. Resected and retrieved.                           - Moderate diverticulosis in the left colon.                           - Internal hemorrhoids. Recommendation:           - Repeat colonoscopy in 3 - 5 years for  surveillance pending pathology review.                           - Patient has a contact number available for                            emergencies. The signs and symptoms of potential                            delayed complications were discussed with the                            patient. Return to normal activities tomorrow.                            Written discharge instructions were provided to the                            patient.                           - High fiber diet.                           - Continue present medications.                           - Await pathology results. Ladene Artist, MD 12/10/2017 9:46:17 AM This report has been signed electronically.

## 2017-12-10 NOTE — Op Note (Signed)
[place] Patient Name: Jasean Ambrosia Procedure Date: 12/10/2017 9:20 AM MRN: [id number] Endoscopist: Ladene Artist , MD Age: 57 Referring MD:  Date of Birth: February 19, 1961 Gender: Male Account #: [id number] Procedure:                Upper GI endoscopy Indications:              Dysphagia, Suspected gastroesophageal reflux disease Medicines:                Monitored Anesthesia Care Procedure:                Pre-Anesthesia Assessment:                           - Prior to the procedure, a History and Physical                            was performed, and patient medications and                            allergies were reviewed. The patient's tolerance of                            previous anesthesia was also reviewed. The risks                            and benefits of the procedure and the sedation                            options and risks were discussed with the patient.                            All questions were answered, and informed consent                            was obtained. Prior Anticoagulants: The patient has                            taken no previous anticoagulant or antiplatelet                            agents. ASA Grade Assessment: II - A patient with                            mild systemic disease. After reviewing the risks                            and benefits, the patient was deemed in                            satisfactory condition to undergo the procedure.                           After obtaining informed consent, the endoscope was  passed under direct vision. Throughout the                            procedure, the patient's blood pressure, pulse, and                            oxygen saturations were monitored continuously. The                            Endoscope was introduced through the mouth, and                            advanced to the second part of duodenum. The upper                            GI  endoscopy was accomplished without difficulty.                            The patient tolerated the procedure well. Scope In: Scope Out: Findings:                 No endoscopic abnormality was evident in the                            esophagus to explain the patient's complaint of                            dysphagia. It was decided, however, to proceed with                            dilation of the entire esophagus. A guidewire was                            placed and the scope was withdrawn. Dilation was                            performed with a Savary dilator with no resistance                            at 17 mm.                           Diffuse moderate inflammation characterized by                            friability, erythema and granularity was found in                            the entire examined stomach. Biopsies were taken                            with a cold forceps for histology.  The exam of the stomach was otherwise normal.                           A few localized erosions without bleeding were                            found in the duodenal bulb.                           The second portion of the duodenum was normal. Complications:            No immediate complications. Estimated Blood Loss:     Estimated blood loss was minimal. Impression:               - No endoscopic esophageal abnormality to explain                            patient's dysphagia. Esophagus dilated. Dilated.                           - Gastritis. Biopsied.                           - Duodenal erosions without bleeding.                           - Normal second portion of the duodenum. Recommendation:           - Patient has a contact number available for                            emergencies. The signs and symptoms of potential                            delayed complications were discussed with the                            patient. Return to normal activities  tomorrow.                            Written discharge instructions were provided to the                            patient.                           - Clear liquid diet for 2 hours, then advance as                            tolerated to soft diet today.                           - Continue present medications.                           - Start taking omeprazole daily as prescribed.                           -  Await pathology results.                           - GI office appt in 6 weeks. Ladene Artist, MD 12/10/2017 9:58:40 AM This report has been signed electronically.

## 2017-12-10 NOTE — Progress Notes (Signed)
A and O x3. Report to RN. Tolerated MAC anesthesia well.Teeth unchanged after procedure.

## 2017-12-11 ENCOUNTER — Telehealth: Payer: Self-pay | Admitting: *Deleted

## 2017-12-11 NOTE — Telephone Encounter (Signed)
No answer. Number identifier. Message left to call if questions or concerns and we would make an attempt later in the day to reach them again.

## 2017-12-11 NOTE — Telephone Encounter (Signed)
  Follow up Call-  Call back number 12/10/2017  Post procedure Call Back phone  # [phone]  Permission to leave phone message Yes  Some recent data might be hidden     Patient questions:  Do you have a fever, pain , or abdominal swelling? No. Pain Score  0 *  Have you tolerated food without any problems? Yes.    Have you been able to return to your normal activities? Yes.    Do you have any questions about your discharge instructions: Diet   No. Medications  No. Follow up visit  No.  Do you have questions or concerns about your Care? No.  Actions: * If pain score is 4 or above: No action needed, pain <4.

## 2017-12-22 ENCOUNTER — Other Ambulatory Visit: Payer: Self-pay

## 2017-12-22 MED ORDER — BIS SUBCIT-METRONID-TETRACYC 140-125-125 MG PO CAPS: 3.0000 | ORAL_CAPSULE | Freq: Three times a day (TID) | ORAL | 0 refills | Status: DC

## 2017-12-22 MED ORDER — OMEPRAZOLE 20 MG PO CPDR: 20.0000 mg | DELAYED_RELEASE_CAPSULE | Freq: Two times a day (BID) | ORAL | 0 refills | Status: DC

## 2017-12-23 ENCOUNTER — Telehealth: Payer: Self-pay | Admitting: Gastroenterology

## 2017-12-23 MED ORDER — METRONIDAZOLE 250 MG PO TABS: 250.0000 mg | ORAL_TABLET | Freq: Four times a day (QID) | ORAL | 0 refills | Status: DC

## 2017-12-23 MED ORDER — BISMUTH SUBSALICYLATE 262 MG PO CHEW: 524.0000 mg | CHEWABLE_TABLET | Freq: Three times a day (TID) | ORAL | 0 refills | Status: DC

## 2017-12-23 MED ORDER — DOXYCYCLINE HYCLATE 100 MG PO CAPS: 100.0000 mg | ORAL_CAPSULE | Freq: Two times a day (BID) | ORAL | 0 refills | Status: DC

## 2017-12-23 NOTE — Telephone Encounter (Signed)
Mickel Baas (patient's daughter) called back stating she wanted clarification that her father is going to take 4 medications in place of the [place]. Informed Mickel Baas that is correct and for her father to take until completed. Mickel Baas verbalized understanding.

## 2017-12-23 NOTE — Telephone Encounter (Signed)
Left a message for Mickel Baas informing her that we sent alternative medications in the place of P[place] to Mr. [facility]lder's pharmacy. There will be a total of 4 medications to be taken x 14 days and if they have any questions to contact our office.

## 2018-01-25 ENCOUNTER — Other Ambulatory Visit: Payer: Self-pay | Admitting: Cardiovascular Disease

## 2018-02-15 ENCOUNTER — Other Ambulatory Visit: Payer: Self-pay | Admitting: Cardiovascular Disease

## 2018-02-16 MED ORDER — ATORVASTATIN CALCIUM 20 MG PO TABS: 20.0000 mg | ORAL_TABLET | Freq: Every day | ORAL | 0 refills | Status: DC

## 2018-02-16 NOTE — Addendum Note (Signed)
Addended by: Mady Haagensen on: 02/16/2018 11:54 AM   Modules accepted: Orders

## 2018-03-31 ENCOUNTER — Other Ambulatory Visit: Payer: Self-pay | Admitting: Family Medicine

## 2018-04-01 ENCOUNTER — Other Ambulatory Visit: Payer: Self-pay | Admitting: Cardiovascular Disease

## 2018-05-25 ENCOUNTER — Other Ambulatory Visit: Payer: Self-pay | Admitting: Cardiovascular Disease

## 2018-05-31 ENCOUNTER — Telehealth: Payer: Self-pay

## 2018-05-31 NOTE — Telephone Encounter (Signed)
Copied from [place] [id number]. Topic: General - Other >> May 31, 2018 12:14 PM Antonieta Iba C wrote: Pt's daughter called in to schedule pt's flu shot but would like to also have his shingles injection. Is pt okay to have shingles injection?   Please assist further with both injections   CB: [phone]- Mickel Baas (daughter)

## 2018-06-01 NOTE — Telephone Encounter (Signed)
Okay for patient to receive both injections?

## 2018-06-01 NOTE — Telephone Encounter (Signed)
I called and spoke with patient's daughter. Appointment scheduled for Thursday at 10:40am.

## 2018-06-01 NOTE — Telephone Encounter (Signed)
Yes he can receive both on the same day.

## 2018-06-03 ENCOUNTER — Ambulatory Visit (INDEPENDENT_AMBULATORY_CARE_PROVIDER_SITE_OTHER): Payer: BLUE CROSS/BLUE SHIELD

## 2018-06-03 ENCOUNTER — Encounter: Payer: Self-pay | Admitting: Family Medicine

## 2018-06-03 DIAGNOSIS — Z23 Encounter for immunization: Secondary | ICD-10-CM

## 2018-06-03 NOTE — Progress Notes (Signed)
Pt came in to get regular flu shot and shingrix. Pt tolerant well, informations given to the pt. Next dose for shingrix is 08/03/2018-10/02/2018. Pt verbalized understand.

## 2018-06-17 ENCOUNTER — Other Ambulatory Visit: Payer: Self-pay | Admitting: Cardiovascular Disease

## 2018-06-17 MED ORDER — ATORVASTATIN CALCIUM 20 MG PO TABS: 20.0000 mg | ORAL_TABLET | Freq: Every day | ORAL | 0 refills | Status: DC

## 2018-06-17 NOTE — Telephone Encounter (Signed)
° ° °  Per daughter patient has no medication remaining.   *STAT* If patient is at the pharmacy, call can be transferred to refill team.   1. Which medications need to be refilled? (please list name of each medication and dose if known) atorvastatin (LIPITOR) 20 MG tablet  2. Which pharmacy/location (including street and city if local pharmacy) is medication to be sent to? [id number] [address], [place]  3. Do they need a 30 day or 90 day supply? [place]

## 2018-06-17 NOTE — Telephone Encounter (Signed)
Pt's medication was sent to pt's pharmacy as requested. Confirmation received.  °

## 2018-07-04 NOTE — Progress Notes (Signed)
Cardiology Office Note   Date:  07/05/2018   ID:  Brandon Farrell, Achor 1961/03/22, MRN [id number]  PCP:  Lucille Passy, MD  Cardiologist: Dr. Grayland Jack, MD    Chief Complaint  Patient presents with  . Follow-up  . Hyperlipidemia   History of Present Illness: Brandon Farrell is a 57 y.o. male who presents for one year follow up for hypertriglyceridemia, seen for Brandon Farrell.   Brandon Farrell has a past hx of hyperlipidemia. He is a Hispanic gentleman who is accompanied by his daughter and a [place] interpreter today. He presented initially to Brandon Farrell after his son died suddenly in his sleep and he was seeking risk reduction measures. He has no prior cardiac history. 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 He underwent and echocardigram and Myoview stress test in 2013 given complaints of fatigue which showed normal left ventricular systolic function and the Myoviewrevealed no evidence of ischemia and also revealed normal left ventricular systolic function.  He had complaints of palpitations remotely and wore a monitor which did not reveal any arrhythmias. Over the years, his soccer playing has been limited by orthopedic injuries. He was found to have hyperlipemia and was started on Lipitor with improvement   Today he reports that he has been doing well. He has no complaints of chest pain, palpitations, recent illness with cough or fever. He denies orthopnea or LE swelling. He reports that he continues to not play as much soccer as he would like secondary to knee issues. He states that he will sometimes wheeze at night while trying to go to sleep and reports a family hx of asthma on all sides.    Past Medical History:  Diagnosis Date  . Arthritis    left knee  . Cataract   . Cholelithiasis   . Hypercholesterolemia   . Hypertension    no per pt  . Palpitations    a. 03/2012 Ex MV: No isch/infarct;  b. 04/2012 Echo: EF 55-65%, mildly dil  LA.  Marland Kitchen Vertigo     Past Surgical History:  Procedure Laterality Date  . [place]   left  . LAPAROSCOPIC CHOLECYSTECTOMY  april 2013    Current Outpatient Medications  Medication Sig Dispense Refill  . atorvastatin (LIPITOR) 20 MG tablet Take 1 tablet (20 mg total) by mouth daily at 6 PM. Please keep upcoming appt in December with Brandon Farrell for future refills. Thank you 30 tablet 0  . Choline Fenofibrate (FENOFIBRIC ACID) 135 MG CPDR TAKE 1 CAPSULE BY MOUTH EVERY DAY 90 capsule 1  . Ibuprofen (ADVIL) 200 MG CAPS Take by mouth as needed.     Marland Kitchen omeprazole (PRILOSEC) 20 MG capsule Take 1 capsule (20 mg total) by mouth daily. 30 capsule 3   Current Facility-Administered Medications  Medication Dose Route Frequency Provider Last Rate Last Dose  . 0.9 %  sodium chloride infusion  500 mL Intravenous Once Brandon Artist, MD       Allergies:   Patient has no known allergies.   Social History:  The patient  reports that he has never smoked. He has never used smokeless tobacco. He reports that he drinks about 3.0 standard drinks of alcohol per week. He reports that he does not use drugs.   Family History:  The patient's family history includes Heart disease in his son.   ROS:  Please see the history of present  illness. Otherwise, review of systems are positive for none. All other systems are reviewed and negative.    PHYSICAL EXAM: VS:  BP 122/72   Ht 5\' 4"  (1.626 m)   Wt 200 lb (90.7 kg)   BMI 34.33 kg/m  , BMI Body mass index is 34.33 kg/m.   General: Well developed, well nourished, NAD Skin: Warm, dry, intact  Head: Normocephalic, atraumatic, clear, moist mucus membranes. Neck: Negative for carotid bruits. No JVD Lungs:Clear to ausculation bilaterally. No wheezes, rales, or rhonchi. Breathing is unlabored. Cardiovascular: RRR with S1 S2. No murmurs, rubs, gallops, or LV heave appreciated. MSK: Strength and tone appear normal for age. 5/5 in all  extremities Extremities: No edema. No clubbing or cyanosis. DP/PT pulses 2+ bilaterally Neuro: Alert and oriented. No focal deficits. No facial asymmetry. MAE spontaneously. Psych: Responds to questions appropriately with normal affect.     EKG:  EKG is ordered today. The ekg ordered today demonstrates NSR   Recent Labs: No results found for requested labs within last 8760 hours.   Lipid Panel    Component Value Date/Time   CHOL 187 02/24/2017 1315   CHOL 141 11/19/2016 1631   TRIG (H) 02/24/2017 1315    432.0 Triglyceride is over 400; calculations on Lipids are invalid.   HDL 27.20 (L) 02/24/2017 1315   HDL 33 (L) 11/19/2016 1631   CHOLHDL 7 02/24/2017 1315   VLDL 18 02/04/2016 0829   LDLCALC 89 11/19/2016 1631   LDLDIRECT 93.0 02/24/2017 1315     Wt Readings from Last 3 Encounters:  07/05/18 200 lb (90.7 kg)  12/10/17 202 lb (91.6 kg)  11/30/17 202 lb 12.8 oz (92 kg)     Other studies Reviewed: Additional studies/ records that were reviewed today include:   Echocardiogram 04/27/2012: Study Conclusions  - Left ventricle: The cavity size was normal. Wall thickness was normal. Systolic function was normal. The estimated ejection fraction was in the range of 55% to 65%. Wall motion was normal; there were no regional wall motion abnormalities. Left ventricular diastolic function parameters were normal. - Left atrium: The atrium was mildly dilated. - Pulmonary arteries: Systolic pressure was within the normal range. Impressions:  - Normal study Transthoracic echocardiography. M-mode, complete 2D, spectral Doppler, and color Doppler. Height: Height: 157.5cm. Height: 62in. Weight: Weight: 87.1kg. Weight: 191.6lb. Body mass index: BMI: 35.1kg/m^2. Body surface area:  BSA: 1.64m^2. Blood pressure:   122/60. Patient status: Outpatient.  Myoview Stress test 04/01/2012: Normal stress Myoview    ASSESSMENT AND PLAN:  1.  Hyperlipidemia: -Last lipid panel with direct LDL at 93, CHo 187, HDL 27 and triglycerides at 432 -Continue lipitor 20, fenofibric acid 135mg   -Will obtain lipid panel>>pt ate breakfast and will return tomorrow morning for lab draw -Educated on diet and exercise recommendations including low fat, low carb, high fiber diet with the incorporation of daily aerobic exercise of at least 30 minutes per day -May make medication adjustments based on lab results   2. Wheezing: -No wheezing on exam today -Denis SOB, asymptomatic  -Recommended following with PCP regarding nocturnal wheezing with concern for underlying asthma -Pt agrees understands and agrees with the plan  Current medicines are reviewed at length with the patient today.  The patient does not have concerns regarding medicines.  The following changes have been made:  no change  Labs/ tests ordered today include: Lipid panel   Orders Placed This Encounter  Procedures  . Lipid Profile  . EKG 12-Lead   Disposition:  FU with Brandon Farrell in 1 year  Signed, Brandon Drown, NP  07/05/2018 10:38 AM    [address], [place], [place]  [address] Phone: [phone]; Fax: [phone]

## 2018-07-05 ENCOUNTER — Encounter: Payer: Self-pay | Admitting: Cardiology

## 2018-07-05 ENCOUNTER — Encounter (INDEPENDENT_AMBULATORY_CARE_PROVIDER_SITE_OTHER): Payer: Self-pay

## 2018-07-05 ENCOUNTER — Ambulatory Visit: Payer: BLUE CROSS/BLUE SHIELD | Admitting: Cardiology

## 2018-07-05 VITALS — BP 122/72 | Ht 64.0 in | Wt 200.0 lb

## 2018-07-05 DIAGNOSIS — R002 Palpitations: Secondary | ICD-10-CM

## 2018-07-05 DIAGNOSIS — E782 Mixed hyperlipidemia: Secondary | ICD-10-CM

## 2018-07-05 MED ORDER — ATORVASTATIN CALCIUM 20 MG PO TABS: 20.0000 mg | ORAL_TABLET | Freq: Every day | ORAL | 3 refills | Status: DC

## 2018-07-05 NOTE — Addendum Note (Signed)
Addended by: Frederik Schmidt on: 07/05/2018 10:40 AM   Modules accepted: Orders

## 2018-07-05 NOTE — Patient Instructions (Signed)
Medication Instructions:  NONE If you need a refill on your cardiac medications before your next appointment, please call your pharmacy.   Lab work:TOMORROW LIPID PANEL  If you have labs (blood work) drawn today and your tests are completely normal, you will receive your results only by: Marland Kitchen MyChart Message (if you have MyChart) OR . A paper copy in the mail If you have any lab test that is abnormal or we need to change your treatment, we will call you to review the results.  Testing/Procedures: NONE  Follow-Up: At [facility] you and your health needs are our priority.  As part of our continuing mission to provide you with exceptional heart care, we have created designated Provider Care Teams.  These Care Teams include your primary Cardiologist (physician) and Advanced Practice Providers (APPs -  Physician Assistants and Nurse Practitioners) who all work together to provide you with the care you need, when you need it. You will need a follow up appointment in:  1 years.  Please call our office 2 months in advance to schedule this appointment.  You may see Dr Acie Fredrickson or one of the following Advanced Practice Providers on your designated Care Team: Richardson Dopp, PA-C [place], [place] . Daune Perch, NP  Any Other Special Instructions Will Be Listed Below (If Applicable).

## 2018-07-06 ENCOUNTER — Other Ambulatory Visit: Payer: Self-pay | Admitting: Gastroenterology

## 2018-07-06 ENCOUNTER — Other Ambulatory Visit: Payer: BLUE CROSS/BLUE SHIELD

## 2018-07-06 DIAGNOSIS — E782 Mixed hyperlipidemia: Secondary | ICD-10-CM

## 2018-07-06 DIAGNOSIS — R002 Palpitations: Secondary | ICD-10-CM

## 2018-07-06 LAB — LIPID PANEL
Chol/HDL Ratio: 4.7 ratio (ref 0.0–5.0)
Cholesterol, Total: 155 mg/dL (ref 100–199)
HDL: 33 mg/dL — ABNORMAL LOW (ref >39)
LDL CALC: 104 mg/dL — AB (ref 0–99)
Triglycerides: 92 mg/dL (ref 0–149)
VLDL Cholesterol Cal: 18 mg/dL (ref 5–40)

## 2018-07-09 ENCOUNTER — Other Ambulatory Visit: Payer: Self-pay

## 2018-07-09 MED ORDER — OMEPRAZOLE 20 MG PO CPDR: 20.0000 mg | DELAYED_RELEASE_CAPSULE | Freq: Every day | ORAL | 3 refills | Status: DC

## 2018-08-03 ENCOUNTER — Ambulatory Visit (INDEPENDENT_AMBULATORY_CARE_PROVIDER_SITE_OTHER): Payer: BLUE CROSS/BLUE SHIELD | Admitting: Behavioral Health

## 2018-08-03 DIAGNOSIS — Z23 Encounter for immunization: Secondary | ICD-10-CM

## 2018-08-03 NOTE — Progress Notes (Signed)
Patient came in clinic today for 2nd Shingrix vaccination. IM injection was given in the left deltoid. Patient tolerated the injection well. There were no signs or symptoms of a reaction prior to patient leaving the nurse visit.

## 2018-08-18 ENCOUNTER — Ambulatory Visit: Payer: BLUE CROSS/BLUE SHIELD | Admitting: Family Medicine

## 2018-08-18 ENCOUNTER — Encounter: Payer: Self-pay | Admitting: Family Medicine

## 2018-08-18 VITALS — BP 124/72 | HR 78 | Temp 99.1°F | Ht 64.0 in | Wt 201.2 lb

## 2018-08-18 DIAGNOSIS — K644 Residual hemorrhoidal skin tags: Secondary | ICD-10-CM | POA: Diagnosis not present

## 2018-08-18 MED ORDER — HYDROCORTISONE 2.5 % RE CREA: 1.0000 "application " | TOPICAL_CREAM | Freq: Two times a day (BID) | RECTAL | 0 refills | Status: DC

## 2018-08-18 NOTE — Assessment & Plan Note (Signed)
New- non thrombosed. Advised sitz baths, anusol as needed. Call or return to clinic prn if these symptoms worsen or fail to improve as anticipated. The patient indicates understanding of these issues and agrees with the plan.

## 2018-08-18 NOTE — Patient Instructions (Signed)
Hemorroides  Hemorrhoids  Las hemorroides son venas inflamadas adentro o alrededor del recto o del ano. Hay dos tipos de hemorroides:   Hemorroides internas. Se forman en las venas del interior del recto. Pueden abultarse hacia afuera, irritarse y doler.   Hemorroides externas. Se producen en las venas externas del ano y pueden sentirse como un bulto o zona hinchada, dura y dolorosa cerca del ano.  La mayora de las hemorroides no causan problemas graves y se pueden controlar con tratamientos caseros como los cambios en la dieta y el estilo de vida. Si los tratamientos caseros no ayudan con los sntomas, se pueden realizar procedimientos para reducir o extirpar las hemorroides.  Cules son las causas?  La causa de esta afeccin es el aumento de la presin en la zona anal. Esta presin puede ser causada por distintos factores, por ejemplo:   Estreimiento.   Hacer un gran esfuerzo para defecar.   Diarrea.   Embarazo.   Obesidad.   Estar sentado durante largos perodos de tiempo.   Levantar objetos pesados u otras actividades que impliquen esfuerzo.   Sexo anal.   Andar en bicicleta por un largo perodo de tiempo.  Cules son los signos o los sntomas?  Los sntomas de esta afeccin incluyen los siguientes:   Dolor.   Picazn o irritacin anal.   Sangrado rectal.   Prdida de materia fecal (heces).   Inflamacin anal.   Uno o ms bultos alrededor del ano.  Cmo se diagnostica?  Esta afeccin se diagnostica frecuentemente a travs de un examen visual. Posiblemente le realicen otros tipos de pruebas o estudios, como los siguientes:   Un examen que implica palpar el rea rectal con la mano enguantada (examen rectal digital).   Un examen del canal anal que se realiza utilizando un pequeo tubo (anoscopio).   Anlisis de sangre si ha perdido una cantidad significativa de sangre.   Una prueba que consiste en la observacin del interior del colon utilizando un tubo flexible con una cmara en el  extremo (sigmoidoscopia o colonoscopa).  Cmo se trata?  Esta afeccin generalmente se puede tratar en el hogar. Sin embargo, se pueden realizar diversos procedimientos si los cambios en la dieta, en el estilo de vida y otros tratamientos caseros no alivian los sntomas. Estos procedimientos pueden ayudar a reducir o extirpar las hemorroides completamente. Algunos de estos procedimientos son quirrgicos y otros no. Algunos de los procedimientos ms frecuentes son los siguientes:   Ligadura con banda elstica. Las bandas elsticas se colocan en la base de las hemorroides para interrumpir su irrigacin de sangre.   Escleroterapia. Se inyecta un medicamento en las hemorroides para reducir su tamao.   Coagulacin con luz infrarroja. Se utiliza un tipo de energa lumnica para eliminar las hemorroides.   Hemorroidectoma. Las hemorroides se extirpan con ciruga y las venas que las irrigan se atan.   Hemorroidopexia con grapas. El cirujano engrapa la base de las hemorroides a la pared del recto.  Siga estas indicaciones en su casa:  Comida y bebida     Consuma alimentos con alto contenido de fibra, como cereales integrales, porotos, frutos secos, frutas y verduras.   Pregntele a su mdico acerca de tomar productos con fibra aadida en ellos (complementos de fibra).   Disminuya la cantidad de grasa de la dieta. Esto se puede lograr consumiendo productos lcteos con bajo contenido de grasas, ingiriendo menor cantidad de carnes rojas y evitando los alimentos procesados.   Beba suficiente lquido como para mantener   la orina de color amarillo plido.  Control del dolor y la hinchazn     Tome baos de asiento tibios durante 20 minutos, 3 o 4 veces por da para calmar el dolor y las molestias. Puede hacer esto en una baera o usar un dispositivo porttil para bao de asiento que se coloca sobre el inodoro.   Si se lo indican, aplique hielo en la zona afectada. Usar compresas de hielo entre los baos de asiento  puede ser efectivo.  ? Ponga el hielo en una bolsa plstica.  ? Coloque una toalla entre la piel y la bolsa.  ? Coloque el hielo durante 20minutos, 2 a 3veces por da.  Indicaciones generales   Tome los medicamentos de venta libre y los recetados solamente como se lo haya indicado el mdico.   Aplquese los medicamentos, cremas o supositorios como se lo hayan indicado.   Haga ejercicio con regularidad. Consulte al mdico qu cantidad y qu tipo de ejercicio es mejor para usted. En general, debe realizar al menos 30minutos de ejercicio moderado la mayora de los das de la semana (150 minutos cada semana). Esto puede incluir actividades como caminar, andar en bicicleta o practicar yoga.   Vaya al bao cuando sienta la necesidad de defecar. No espere.   Evite hacer fuerza en las deposiciones.   Mantenga la zona anal limpia y seca. Use papel higinico hmedo o toallitas humedecidas despus de las deposiciones.   No pase mucho tiempo sentado en el inodoro. Esto aumenta la afluencia de sangre y el dolor.   Concurra a todas las visitas de seguimiento como se lo haya indicado el mdico. Esto es importante.  Comunquese con un mdico si tiene:   Aumento del dolor y la hinchazn que no puede controlar con medicamentos o tratamiento.   No puede defecar o lo hace con dificultad.   Dolor o tiene inflamacin fuera de la zona de las hemorroides.  Solicite ayuda inmediatamente si tiene:   Hemorragia descontrolada en el recto.  Resumen   Las hemorroides son venas inflamadas adentro o alrededor del recto o del ano.   La mayora de las hemorroides se pueden controlar con tratamientos caseros como cambios en la dieta y el estilo de vida.   Tomar baos de asiento con agua tibia puede ayudar a aliviar el dolor y las molestias.   En los casos graves, se pueden realizar procedimientos o una ciruga para reducir o extirpar las hemorroides.  Esta informacin no tiene como fin reemplazar el consejo del mdico. Asegrese de  hacerle al mdico cualquier pregunta que tenga.  Document Released: 07/14/2005 Document Revised: 01/21/2018 Document Reviewed: 01/21/2018  Elsevier Interactive Patient Education  2019 Elsevier Inc.

## 2018-08-18 NOTE — Progress Notes (Signed)
Subjective:   Patient ID: Brandon Farrell, male    DOB: November 02, 1960, 58 y.o.   MRN: [id number]  Brandon Farrell is a pleasant 58 y.o. year old male who presents to clinic today with Hemorrhoids (Patient is here today C/O Hemorrhoids.  He states that it has been uncomfortable to sit or have a BM for 1 week.  Denies any bleeding or any signs of blood in BM's.  Denies any constipation.  Has not used any OTC Tx.  States that he can feel it so it is external.)  on 08/18/2018  HPI:  External hemorrhoid- noticed this for 1 week. He can feel it.  Was very painful but less so, now itchy and he can barely feel it.  Used preparation H.  No constipation recently. No blood in stool.  No painful BMs.  Colonoscopy done by Dr. Fuller Plan on 12/10/17- 3-5 year recall due to polyps. He did have internal hemorrhoids also noted.   Current Outpatient Medications on File Prior to Visit  Medication Sig Dispense Refill  . atorvastatin (LIPITOR) 20 MG tablet Take 1 tablet (20 mg total) by mouth daily at 6 PM. Please keep upcoming appt in December with Dr. Acie Fredrickson for future refills. Thank you 30 tablet 3  . Choline Fenofibrate (FENOFIBRIC ACID) 135 MG CPDR TAKE 1 CAPSULE BY MOUTH EVERY DAY 90 capsule 1  . Ibuprofen (ADVIL) 200 MG CAPS Take by mouth as needed.     Marland Kitchen omeprazole (PRILOSEC) 20 MG capsule Take 1 capsule (20 mg total) by mouth daily. 30 capsule 3   No current facility-administered medications on file prior to visit.     No Known Allergies  Past Medical History:  Diagnosis Date  . Arthritis    left knee  . Cataract   . Cholelithiasis   . Hypercholesterolemia   . Hypertension    no per pt  . Palpitations    a. 03/2012 Ex MV: No isch/infarct;  b. 04/2012 Echo: EF 55-65%, mildly dil LA.  Marland Kitchen Vertigo     Past Surgical History:  Procedure Laterality Date  . [place]   left  . LAPAROSCOPIC CHOLECYSTECTOMY  april 2013    Family History  Problem Relation Age of Onset  . Heart disease Son   .  Colon cancer Neg Hx   . Stomach cancer Neg Hx   . Esophageal cancer Neg Hx   . Rectal cancer Neg Hx     Social History   Socioeconomic History  . Marital status: Married    Spouse name: Not on file  . Number of children: 2  . Years of education: Not on file  . Highest education level: Not on file  Occupational History  . Occupation: [employer]    Employer: Self Employed  Social Needs  . Financial resource strain: Not on file  . Food insecurity:    Worry: Not on file    Inability: Not on file  . Transportation needs:    Medical: Not on file    Non-medical: Not on file  Tobacco Use  . Smoking status: Never Smoker  . Smokeless tobacco: Never Used  Substance and Sexual Activity  . Alcohol use: Yes    Alcohol/week: 3.0 standard drinks    Types: 3 Cans of beer per week    Comment: occasional  . Drug use: No  . Sexual activity: Not on file  Lifestyle  . Physical activity:    Days per week: Not on file  Minutes per session: Not on file  . Stress: Not on file  Relationships  . Social connections:    Talks on phone: Not on file    Gets together: Not on file    Attends religious service: Not on file    Active member of club or organization: Not on file    Attends meetings of clubs or organizations: Not on file    Relationship status: Not on file  . Intimate partner violence:    Fear of current or ex partner: Not on file    Emotionally abused: Not on file    Physically abused: Not on file    Forced sexual activity: Not on file  Other Topics Concern  . Not on file  Social History Narrative  . Not on file   The PMH, PSH, Social History, Family History, Medications, and allergies have been reviewed in [facility], and have been updated if relevant.   Review of Systems  Constitutional: Negative.   Gastrointestinal: Positive for rectal pain. Negative for abdominal distention, abdominal pain, anal bleeding, blood in stool, constipation, diarrhea, nausea and vomiting.    Musculoskeletal: Negative.   All other systems reviewed and are negative.      Objective:    BP 124/72 (BP Location: Left Arm, Patient Position: Sitting, Cuff Size: Normal)   Pulse 78   Temp 99.1 F (37.3 C) (Oral)   Ht 5\' 4"  (1.626 m)   Wt 201 lb 3.2 oz (91.3 kg)   SpO2 95%   BMI 34.54 kg/m    Physical Exam Vitals signs and nursing note reviewed.  Constitutional:      General: He is not in acute distress.    Appearance: Normal appearance. He is not ill-appearing.  HENT:     Head: Normocephalic and atraumatic.     Nose: Nose normal.     Mouth/Throat:     Mouth: Mucous membranes are moist.  Eyes:     Extraocular Movements: Extraocular movements intact.  Neck:     Musculoskeletal: Normal range of motion.  Cardiovascular:     Rate and Rhythm: Normal rate.  Pulmonary:     Effort: Pulmonary effort is normal.  Abdominal:     General: Abdomen is flat.  Genitourinary:    Rectum: External hemorrhoid present. No anal fissure.     Comments: Small external hemorrhoid, non thrombosed. Skin:    General: Skin is warm and dry.  Neurological:     General: No focal deficit present.     Mental Status: He is alert and oriented to person, place, and time.  Psychiatric:        Mood and Affect: Mood normal.        Behavior: Behavior normal.        Thought Content: Thought content normal.        Judgment: Judgment normal.           Assessment & Plan:   No diagnosis found. No follow-ups on file.

## 2018-10-06 ENCOUNTER — Other Ambulatory Visit: Payer: Self-pay | Admitting: Family Medicine

## 2018-10-19 ENCOUNTER — Other Ambulatory Visit: Payer: Self-pay | Admitting: Cardiology

## 2019-02-03 ENCOUNTER — Telehealth: Payer: Self-pay

## 2019-02-03 NOTE — Telephone Encounter (Signed)
Copied from [place] [id number]. Topic: General - Other >> Feb 03, 2019  9:39 AM Brandon Farrell wrote: Reason for CRM:  Patient daughter Mickel Baas called to say that her sister is exhibiting some COVID symptoms and lives in the house with this patient she was tested on 02/02/2019 but the question is. Should they wait until her results are back before getting the patient tested. Sister is quarantining from the rest of the family at this time. Ph# [phone]

## 2019-02-04 ENCOUNTER — Telehealth: Payer: Self-pay

## 2019-02-04 DIAGNOSIS — Z20822 Contact with and (suspected) exposure to covid-19: Secondary | ICD-10-CM

## 2019-02-04 NOTE — Telephone Encounter (Signed)
Dr. Deborra Medina, please advise if pt should go ahead and be tested or wait until the sisters results are back?

## 2019-02-04 NOTE — Telephone Encounter (Signed)
Spoke with pt's daughter. She is aware testing request has been made.

## 2019-02-04 NOTE — Telephone Encounter (Signed)
Incoming call requesting that Patient be tested for Covid-19.  Called Patient talked with Daughter.  Patient scheduled for Monday 02/07/19@930am  Daughter voices understanding. Order Placed.

## 2019-02-04 NOTE — Telephone Encounter (Signed)
Yes I would go ahead and get tested since some are most contagious before they have symptoms.  Please send testing request to [facility] pool.

## 2019-02-07 ENCOUNTER — Other Ambulatory Visit: Payer: Self-pay

## 2019-02-07 ENCOUNTER — Other Ambulatory Visit: Payer: BLUE CROSS/BLUE SHIELD

## 2019-02-07 DIAGNOSIS — Z20822 Contact with and (suspected) exposure to covid-19: Secondary | ICD-10-CM

## 2019-02-07 NOTE — Addendum Note (Signed)
Addended by: Denman George on: 02/07/2019 09:22 AM   Modules accepted: Orders

## 2019-02-10 LAB — NOVEL CORONAVIRUS, NAA: SARS-CoV-2, NAA: NOT DETECTED

## 2019-02-15 ENCOUNTER — Telehealth: Payer: Self-pay

## 2019-02-15 NOTE — Telephone Encounter (Signed)
FYI: TA-Plz see note below/pt and family members in the home all tested for COVID but pt results are negative although he is still symptomatic/I LMOVM stating that although his test was negative and he is still having Sx it could very well be a false negative/I suggested that he quarantine with the rest of the household and if has any other questions may call back/plz advise if you would like for me to add to the information I left them above/thx dmf

## 2019-02-15 NOTE — Telephone Encounter (Signed)
Copied from [place] [phone]. Topic: General - Inquiry >> Feb 15, 2019 11:13 AM Virl Axe D wrote: Reason for CRM: Pt's daughter Mickel Baas called to follow up about pt's covid-19 results. Pt tested negative and is still having some symptoms. Other members in the household tested positive and are quarantining in the house. She would like to know what Dr. Deborra Medina suggests. Return call requested. OM#[phone]

## 2019-02-15 NOTE — Telephone Encounter (Signed)
That's perfect advice.  Thank you.

## 2019-02-16 NOTE — Telephone Encounter (Signed)
Patient's daughter is waiting for a call from the nurse regarding her father's condition.  She stated that the nurse was supposed to call her yesterday to let her know what she should do.  Please advise and call back at --

## 2019-02-18 NOTE — Telephone Encounter (Signed)
I spoke to daughter and advised that I left her a message 3-days-ago and reiterated the advise/thx dmf

## 2019-02-21 ENCOUNTER — Emergency Department (HOSPITAL_COMMUNITY): Payer: BLUE CROSS/BLUE SHIELD

## 2019-02-21 ENCOUNTER — Encounter (HOSPITAL_COMMUNITY): Payer: Self-pay

## 2019-02-21 ENCOUNTER — Inpatient Hospital Stay (HOSPITAL_COMMUNITY)
Admission: EM | Admit: 2019-02-21 | Discharge: 2019-02-26 | DRG: 177 | Disposition: A | Discharge status: Home / Self Care | Payer: BLUE CROSS/BLUE SHIELD | Attending: Family Medicine | Admitting: Family Medicine

## 2019-02-21 ENCOUNTER — Other Ambulatory Visit: Payer: Self-pay

## 2019-02-21 ENCOUNTER — Ambulatory Visit: Payer: Self-pay

## 2019-02-21 DIAGNOSIS — J1289 Other viral pneumonia: Secondary | ICD-10-CM

## 2019-02-21 DIAGNOSIS — U071 COVID-19: Secondary | ICD-10-CM | POA: Diagnosis present

## 2019-02-21 DIAGNOSIS — Z7289 Other problems related to lifestyle: Secondary | ICD-10-CM

## 2019-02-21 DIAGNOSIS — R740 Nonspecific elevation of levels of transaminase and lactic acid dehydrogenase [LDH]: Secondary | ICD-10-CM | POA: Diagnosis present

## 2019-02-21 DIAGNOSIS — E860 Dehydration: Secondary | ICD-10-CM | POA: Diagnosis present

## 2019-02-21 DIAGNOSIS — K219 Gastro-esophageal reflux disease without esophagitis: Secondary | ICD-10-CM | POA: Diagnosis present

## 2019-02-21 DIAGNOSIS — Z9049 Acquired absence of other specified parts of digestive tract: Secondary | ICD-10-CM

## 2019-02-21 DIAGNOSIS — I10 Essential (primary) hypertension: Secondary | ICD-10-CM | POA: Diagnosis present

## 2019-02-21 DIAGNOSIS — Z79899 Other long term (current) drug therapy: Secondary | ICD-10-CM

## 2019-02-21 DIAGNOSIS — J9601 Acute respiratory failure with hypoxia: Secondary | ICD-10-CM | POA: Diagnosis present

## 2019-02-21 DIAGNOSIS — E785 Hyperlipidemia, unspecified: Secondary | ICD-10-CM | POA: Diagnosis present

## 2019-02-21 DIAGNOSIS — J1282 Pneumonia due to coronavirus disease 2019: Secondary | ICD-10-CM | POA: Diagnosis present

## 2019-02-21 DIAGNOSIS — N179 Acute kidney failure, unspecified: Secondary | ICD-10-CM | POA: Diagnosis present

## 2019-02-21 DIAGNOSIS — E782 Mixed hyperlipidemia: Secondary | ICD-10-CM | POA: Diagnosis not present

## 2019-02-21 DIAGNOSIS — R197 Diarrhea, unspecified: Secondary | ICD-10-CM | POA: Diagnosis present

## 2019-02-21 LAB — COMPREHENSIVE METABOLIC PANEL
ALT: 41 U/L (ref 0–44)
AST: 41 U/L (ref 15–41)
Albumin: 3.9 g/dL (ref 3.5–5.0)
Alkaline Phosphatase: 97 U/L (ref 38–126)
Anion gap: 12 (ref 5–15)
BUN: 23 mg/dL — ABNORMAL HIGH (ref 6–20)
CO2: 24 mmol/L (ref 22–32)
Calcium: 9.2 mg/dL (ref 8.9–10.3)
Chloride: 101 mmol/L (ref 98–111)
Creatinine, Ser: 1.5 mg/dL — ABNORMAL HIGH (ref 0.61–1.24)
GFR calc Af Amer: 58 mL/min — ABNORMAL LOW (ref >60)
GFR calc non Af Amer: 50 mL/min — ABNORMAL LOW (ref >60)
Glucose, Bld: 108 mg/dL — ABNORMAL HIGH (ref 70–99)
Potassium: 3.9 mmol/L (ref 3.5–5.1)
Sodium: 137 mmol/L (ref 135–145)
Total Bilirubin: 1.3 mg/dL — ABNORMAL HIGH (ref 0.3–1.2)
Total Protein: 8.3 g/dL — ABNORMAL HIGH (ref 6.5–8.1)

## 2019-02-21 LAB — CBC WITH DIFFERENTIAL/PLATELET
Abs Immature Granulocytes: 0.15 10*3/uL — ABNORMAL HIGH (ref 0.00–0.07)
Basophils Absolute: 0 10*3/uL (ref 0.0–0.1)
Basophils Relative: 0 %
Eosinophils Absolute: 0 10*3/uL (ref 0.0–0.5)
Eosinophils Relative: 0 %
HCT: 45.3 % (ref 39.0–52.0)
Hemoglobin: 15.2 g/dL (ref 13.0–17.0)
Immature Granulocytes: 1 %
Lymphocytes Relative: 9 %
Lymphs Abs: 1.1 10*3/uL (ref 0.7–4.0)
MCH: 29.9 pg (ref 26.0–34.0)
MCHC: 33.6 g/dL (ref 30.0–36.0)
MCV: 89.2 fL (ref 80.0–100.0)
Monocytes Absolute: 0.6 10*3/uL (ref 0.1–1.0)
Monocytes Relative: 5 %
Neutro Abs: 10.9 10*3/uL — ABNORMAL HIGH (ref 1.7–7.7)
Neutrophils Relative %: 85 %
Platelets: 248 10*3/uL (ref 150–400)
RBC: 5.08 MIL/uL (ref 4.22–5.81)
RDW: 13 % (ref 11.5–15.5)
WBC: 12.8 10*3/uL — ABNORMAL HIGH (ref 4.0–10.5)
nRBC: 0 % (ref 0.0–0.2)

## 2019-02-21 LAB — D-DIMER, QUANTITATIVE: D-Dimer, Quant: 0.71 ug/mL-FEU — ABNORMAL HIGH (ref 0.00–0.50)

## 2019-02-21 LAB — FERRITIN
Ferritin: 1527 ng/mL — ABNORMAL HIGH (ref 24–336)
Ferritin: 1473 ng/mL — ABNORMAL HIGH (ref 24–336)

## 2019-02-21 LAB — FIBRINOGEN: Fibrinogen: >800 mg/dL — ABNORMAL HIGH (ref 210–475)

## 2019-02-21 LAB — PROCALCITONIN: Procalcitonin: 0.74 ng/mL

## 2019-02-21 LAB — C-REACTIVE PROTEIN
CRP: 30.9 mg/dL — ABNORMAL HIGH (ref <1.0)
CRP: 27 mg/dL — ABNORMAL HIGH (ref <1.0)

## 2019-02-21 LAB — SARS CORONAVIRUS 2 BY RT PCR (HOSPITAL ORDER, PERFORMED IN ~~LOC~~ HOSPITAL LAB): SARS Coronavirus 2: POSITIVE — AB

## 2019-02-21 LAB — LACTATE DEHYDROGENASE: LDH: 227 U/L — ABNORMAL HIGH (ref 98–192)

## 2019-02-21 LAB — BRAIN NATRIURETIC PEPTIDE: B Natriuretic Peptide: 104.9 pg/mL — ABNORMAL HIGH (ref 0.0–100.0)

## 2019-02-21 MED ORDER — GUAIFENESIN-DM 100-10 MG/5ML PO SYRP
10.0000 mL | ORAL_SOLUTION | ORAL | Status: DC | PRN
  Administered 2019-02-23: 10 mL via ORAL
  Filled 2019-02-21: qty 10

## 2019-02-21 MED ORDER — IPRATROPIUM BROMIDE HFA 17 MCG/ACT IN AERS
2.0000 | INHALATION_SPRAY | Freq: Four times a day (QID) | RESPIRATORY_TRACT | Status: DC
  Administered 2019-02-22 – 2019-02-26 (×19): 2 via RESPIRATORY_TRACT
  Filled 2019-02-21: qty 12.9

## 2019-02-21 MED ORDER — ACETAMINOPHEN 500 MG PO TABS
1000.0000 mg | ORAL_TABLET | Freq: Once | ORAL | Status: AC
  Administered 2019-02-21: 15:00:00 1000 mg via ORAL
  Filled 2019-02-21: qty 2

## 2019-02-21 MED ORDER — ALBUTEROL SULFATE HFA 108 (90 BASE) MCG/ACT IN AERS
2.0000 | INHALATION_SPRAY | Freq: Four times a day (QID) | RESPIRATORY_TRACT | Status: DC
  Administered 2019-02-22 – 2019-02-24 (×10): 2 via RESPIRATORY_TRACT
  Filled 2019-02-21: qty 6.7

## 2019-02-21 MED ORDER — DEXAMETHASONE SODIUM PHOSPHATE 10 MG/ML IJ SOLN
8.0000 mg | INTRAMUSCULAR | Status: DC
  Administered 2019-02-22: 8 mg via INTRAVENOUS
  Filled 2019-02-21: qty 1

## 2019-02-21 MED ORDER — ACETAMINOPHEN 500 MG PO TABS
500.0000 mg | ORAL_TABLET | Freq: Four times a day (QID) | ORAL | Status: DC | PRN
  Administered 2019-02-22: 500 mg via ORAL
  Filled 2019-02-21: qty 1

## 2019-02-21 MED ORDER — ENOXAPARIN SODIUM 40 MG/0.4ML ~~LOC~~ SOLN
40.0000 mg | Freq: Every day | SUBCUTANEOUS | Status: DC
  Administered 2019-02-22 – 2019-02-25 (×5): 40 mg via SUBCUTANEOUS
  Filled 2019-02-21 (×5): qty 0.4

## 2019-02-21 MED ORDER — ONDANSETRON HCL 4 MG/2ML IJ SOLN: 4.0000 mg | Freq: Four times a day (QID) | INTRAMUSCULAR | Status: DC | PRN

## 2019-02-21 MED ORDER — ONDANSETRON HCL 4 MG PO TABS: 4.0000 mg | ORAL_TABLET | Freq: Four times a day (QID) | ORAL | Status: DC | PRN

## 2019-02-21 MED ORDER — ONDANSETRON HCL 4 MG/2ML IJ SOLN
4.0000 mg | Freq: Once | INTRAMUSCULAR | Status: AC
  Administered 2019-02-21: 4 mg via INTRAVENOUS
  Filled 2019-02-21: qty 2

## 2019-02-21 MED ORDER — VITAMIN D 25 MCG (1000 UNIT) PO TABS
1000.0000 [IU] | ORAL_TABLET | Freq: Every day | ORAL | Status: DC
  Administered 2019-02-22 – 2019-02-26 (×5): 1000 [IU] via ORAL
  Filled 2019-02-21 (×5): qty 1

## 2019-02-21 MED ORDER — DEXAMETHASONE SODIUM PHOSPHATE 10 MG/ML IJ SOLN
8.0000 mg | Freq: Once | INTRAMUSCULAR | Status: AC
  Administered 2019-02-21: 8 mg via INTRAVENOUS
  Filled 2019-02-21: qty 1

## 2019-02-21 MED ORDER — SODIUM CHLORIDE 0.9 % IV SOLN
INTRAVENOUS | Status: DC
  Administered 2019-02-21 – 2019-02-22 (×3): via INTRAVENOUS

## 2019-02-21 MED ORDER — ATORVASTATIN CALCIUM 10 MG PO TABS
20.0000 mg | ORAL_TABLET | Freq: Every day | ORAL | Status: DC
  Administered 2019-02-22 – 2019-02-26 (×5): 20 mg via ORAL
  Filled 2019-02-21 (×5): qty 2

## 2019-02-21 NOTE — Telephone Encounter (Signed)
Pt's daughter called to report high fever and multiple complaints; daughter is on phone with the pt. And translating for him.  Reported that both mother and another daughter have tested positive with COVID, but the pt. Tested negative.  Reported the pt. C/o fever, headache, body aches, and cough.  Reported the cough is moderate.  Also, c/o loss of taste and smell, nausea, vomiting, and diarrhea.  Pt. c/o shortness of breath with activity.  Temperatures have been averaging 103 degrees during the last week.  Reported that the pt. has been alternating Tylenol and Ibuprofen every 4-8 hrs.  Last temperature was 102.8 at approx. 6:00 AM.  Daughter reported the pt. Is not feeling any better, and c/o increased weakness and fatigue.  Advised due to no improvement in symptoms and high fever, he should be evaluated at the ER.  Advised to have pt. Go to [facility] ER for COVID eval.  Also advised that only one person accompany him to the ER.  Daughter, Mickel Baas, verb. Understanding.    Called [facility] ER Triage Nurse.  Spoke with [employer]. Advised of pt's symptoms, and that pt. and daughter are on way to their facility.  Was advised that upon arrival, pt. Should stay in the car, and call [phone], for a staff member to come out and get him, before entering the hospital.  Call placed to pt's. daughter, Mickel Baas.  Left voice message re: the instructions above.       Reason for Disposition . Patient sounds very sick or weak to the triager  Answer Assessment - Initial Assessment Questions 1. COVID-19 DIAGNOSIS: "Who made your Coronavirus (COVID-19) diagnosis?" "Was it confirmed by a positive lab test?" If not diagnosed by a HCP, ask "Are there lots of cases (community spread) where you live?" (See public health department website, if unsure)    Pt's wife and daughter tested positive; pt. Tested negative, but due to symptoms he is considered false negative   2. ONSET: "When did the COVID-19 symptoms start?"     1-2 weeks 3.  WORST SYMPTOM: "What is your worst symptom?" (e.g., cough, fever, shortness of breath, muscle aches)     Headache and fever 4. COUGH: "Do you have a cough?" If so, ask: "How bad is the cough?"       Cough is moderate  5. FEVER: "Do you have a fever?" If so, ask: "What is your temperature, how was it measured, and when did it start?"     Fevers 103 degrees over past week; last temp. 102.8 @ 5:00-6:00 AM today; has been alternating Tylenol and Ibuprofen every 4-8 hrs.  6. RESPIRATORY STATUS: "Describe your breathing?" (e.g., shortness of breath, wheezing, unable to speak)      With rest no shortness of breath, but c/o increased shortness of breath with activity 7. BETTER-SAME-WORSE: "Are you getting better, staying the same or getting worse compared to yesterday?"  If getting worse, ask, "In what way?"     About the same over last week 8. HIGH RISK DISEASE: "Do you have any chronic medical problems?" (e.g., asthma, heart or lung disease, weak immune system, etc.)     Daughter stated pt. Has been very healthy 9. PREGNANCY: "Is there any chance you are pregnant?" "When was your last menstrual period?"    N/a  10. OTHER SYMPTOMS: "Do you have any other symptoms?"  (e.g., chills, fatigue, headache, loss of smell or taste, muscle pain, sore throat)       Can't smell or taste, c/o  nausea, vomited last night, and diarrhea; poor appetite, headache, fever, increased weakness and fatigue  Protocols used: CORONAVIRUS (COVID-19) DIAGNOSED OR SUSPECTED-A-AH

## 2019-02-21 NOTE — ED Notes (Signed)
[facility] called. 

## 2019-02-21 NOTE — ED Notes (Signed)
[facility] bedside.  

## 2019-02-21 NOTE — H&P (Signed)
History and Physical    Brandon Farrell INO:[id number] DOB: 20-Dec-1960 DOA: 02/21/2019  PCP: Lucille Passy, MD  Patient coming from: home   I have personally briefly reviewed patient's old medical records available.   Chief Complaint: SOB and fever   HPI: Brandon Farrell is a 58 y.o. male with medical history significant of hypertension, hyperlipidemia who is presenting to the hospital with about 10 days of not feeling well, fever and shortness of breath and recently exacerbating symptoms in the context of multiple family members sick with COVID-19.  Patient is Spanish-speaking.  Interpreter was used.  Patient's wife and daughter were tested positive for COVID-19 about 10 days ago, patient also had symptoms of fever and abdominal pain, he was tested and that turned out to be negative, however he had symptoms so he was asked to quadrant time at home.  He was staying home.  His fever has slightly come down, abdominal pain improved, however he continues to have fatigue, generalized myalgia, cough with no sputum production, shortness of breath with exertion.  Has nausea but no vomiting.  He had several episodes of nonbloody watery diarrhea for last few days.  No urinary symptoms.  Patient has been taking ibuprofen and Tylenol at home with some relief.  Difficulty breathing with exertion and extreme fatigue was very concerning so he came to the emergency room after calling his primary care physician. ED Course: Patient is afebrile, taken ibuprofen before coming to the hospital. 91% on room air and started on 2 liters. WBC is 12.8. creatinine 1.5.  cxr shows LLL infiltrates .   Review of Systems: all systems are reviewed and pertinent positive as per HPI otherwise rest are negative.    Past Medical History:  Diagnosis Date  . Arthritis    left knee  . Cataract   . Cholelithiasis   . Hypercholesterolemia   . Hypertension    no per pt  . Palpitations    a. 03/2012 Ex MV: No isch/infarct;  b. 04/2012  Echo: EF 55-65%, mildly dil LA.  Marland Kitchen Vertigo     Past Surgical History:  Procedure Laterality Date  . [place]   left  . LAPAROSCOPIC CHOLECYSTECTOMY  april 2013     reports that he has never smoked. He has never used smokeless tobacco. He reports current alcohol use of about 3.0 standard drinks of alcohol per week. He reports that he does not use drugs.  No Known Allergies  Family History  Problem Relation Age of Onset  . Heart disease Son   . Colon cancer Neg Hx   . Stomach cancer Neg Hx   . Esophageal cancer Neg Hx   . Rectal cancer Neg Hx      Prior to Admission medications   Medication Sig Start Date End Date Taking? Authorizing Provider  acetaminophen (TYLENOL) 500 MG tablet Take 500 mg by mouth every 6 (six) hours as needed for fever or headache.   Yes [provider]  atorvastatin (LIPITOR) 20 MG tablet TAKE 1 TABLET (20 MG TOTAL) BY MOUTH DAILY AT 6 PM. Patient taking differently: Take 20 mg by mouth daily.  10/19/18  Yes Nahser, Wonda Cheng, MD  cholecalciferol (VITAMIN D3) 25 MCG (1000 UT) tablet Take 1,000 Units by mouth daily.   Yes [provider]  Choline Fenofibrate (FENOFIBRIC ACID) 135 MG CPDR TAKE 1 CAPSULE BY MOUTH EVERY DAY 10/06/18  Yes Lucille Passy, MD  ibuprofen (ADVIL) 200 MG tablet Take 400 mg by  mouth every 6 (six) hours as needed for fever.   Yes [provider]    Physical Exam: Vitals:   02/21/19 1400 02/21/19 1430 02/21/19 1500 02/21/19 1530  BP: 121/78 123/81 128/81 131/82  Pulse: 70 64 70 73  Resp: (!) 31 (!) 29 (!) 26 (!) 35  Temp:      TempSrc:      SpO2: 94% 100% 97% 96%    Constitutional: NAD, calm, comfortable Vitals:   02/21/19 1400 02/21/19 1430 02/21/19 1500 02/21/19 1530  BP: 121/78 123/81 128/81 131/82  Pulse: 70 64 70 73  Resp: (!) 31 (!) 29 (!) 26 (!) 35  Temp:      TempSrc:      SpO2: 94% 100% 97% 96%   Eyes: PERRL, lids and conjunctivae normal ENMT: Mucous membranes are moist.  Posterior pharynx clear of any exudate or lesions.Normal dentition.  Neck: normal, supple, no masses, no thyromegaly Respiratory: mostly some conducted airway sounds , no wheezing, no crackles. Normal respiratory effort. No accessory muscle use.  Cardiovascular: Regular rate and rhythm, no murmurs / rubs / gallops. No extremity edema. 2+ pedal pulses. No carotid bruits.  Abdomen: no tenderness, no masses palpated. No hepatosplenomegaly. Bowel sounds positive.  Musculoskeletal: no clubbing / cyanosis. No joint deformity upper and lower extremities. Good ROM, no contractures. Normal muscle tone.  Skin: no rashes, lesions, ulcers. No induration Neurologic: CN 2-12 grossly intact. Sensation intact, DTR normal. Strength 5/5 in all 4.  Psychiatric: Normal judgment and insight. Alert and oriented x 3. Normal mood.     Labs on Admission: I have personally reviewed following labs and imaging studies  CBC: Recent Labs  Lab 02/21/19 1135  WBC 12.8*  NEUTROABS 10.9*  HGB 15.2  HCT 45.3  MCV 89.2  PLT 502   Basic Metabolic Panel: Recent Labs  Lab 02/21/19 1135  NA 137  K 3.9  CL 101  CO2 24  GLUCOSE 108*  BUN 23*  CREATININE 1.50*  CALCIUM 9.2   GFR: CrCl cannot be calculated (Unknown ideal weight.). Liver Function Tests: Recent Labs  Lab 02/21/19 1135  AST 41  ALT 41  ALKPHOS 97  BILITOT 1.3*  PROT 8.3*  ALBUMIN 3.9   No results for input(s): LIPASE, AMYLASE in the last 168 hours. No results for input(s): AMMONIA in the last 168 hours. Coagulation Profile: No results for input(s): INR, PROTIME in the last 168 hours. Cardiac Enzymes: No results for input(s): CKTOTAL, CKMB, CKMBINDEX, TROPONINI in the last 168 hours. BNP (last 3 results) No results for input(s): PROBNP in the last 8760 hours. HbA1C: No results for input(s): HGBA1C in the last 72 hours. CBG: No results for input(s): GLUCAP in the last 168 hours. Lipid Profile: No results for input(s): CHOL, HDL,  LDLCALC, TRIG, CHOLHDL, LDLDIRECT in the last 72 hours. Thyroid Function Tests: No results for input(s): TSH, T4TOTAL, FREET4, T3FREE, THYROIDAB in the last 72 hours. Anemia Panel: No results for input(s): VITAMINB12, FOLATE, FERRITIN, TIBC, IRON, RETICCTPCT in the last 72 hours. Urine analysis:    Component Value Date/Time   COLORURINE Yellow 11/24/2011 2203   COLORURINE yellow 08/09/2009 0858   APPEARANCEUR Clear 11/24/2011 2203   LABSPEC 1.019 11/24/2011 2203   PHURINE 6.0 11/24/2011 2203   PHURINE 6.5 08/09/2009 0858   GLUCOSEU Negative 11/24/2011 2203   HGBUR Negative 11/24/2011 2203   HGBUR moderate 08/09/2009 0858   BILIRUBINUR negative 10/09/2016 1302   BILIRUBINUR Negative 11/24/2011 2203   KETONESUR Negative 11/24/2011 2203  PROTEINUR negative 10/09/2016 1302   PROTEINUR Negative 11/24/2011 2203   UROBILINOGEN 0.2 10/09/2016 1302   UROBILINOGEN 0.2 08/09/2009 0858   NITRITE negative 10/09/2016 1302   NITRITE Negative 11/24/2011 2203   NITRITE negative 08/09/2009 0858   LEUKOCYTESUR Negative 11/24/2011 2203    Radiological Exams on Admission: Dg Chest Portable 1 View  Result Date: 02/21/2019 CLINICAL DATA:  Worsening fever, weakness, covid symptoms x 2 weeks, states 2 family members tested positive EXAM: PORTABLE CHEST 1 VIEW COMPARISON:  None. FINDINGS: Heart size is accentuated by technique and normal. There is faint patchy opacity at the LEFT lung base and the RIGHT lung apex no pulmonary edema. IMPRESSION: Multifocal infiltrates. Electronically Signed   By: [name] Nations M.D.   On: 02/21/2019 12:10    EKG: Independently reviewed.  Normal sinus rhythm.  No acute ST-T wave changes.  Assessment/Plan Principal Problem:   Pneumonia due to COVID-19 virus Active Problems:   HYPERLIPIDEMIA   GERD   AKI (acute kidney injury) ([place])     1.  Pneumonia due to COVID-19 virus: Agree with admission given severity of symptoms. Chest physiotherapy, incentive  spirometry, deep breathing exercises, sputum induction, mucolytic's and bronchodilators. Sputum cultures, blood cultures. Supplemental oxygen to keep saturations more than 90%.  2.  COVID-19 directed therapy: Dexamethasone 8 mg IV first dose now and will continue. Inflammatory biomarkers pending, if CRP more than 7, will give Actemra.  3.  Acute kidney injury: Patient is clinically dehydrated with ongoing diarrhea.  He does not have other evidence of fluid overload.  Will provide with IV fluids overnight.  Recheck levels tomorrow morning.  4.  Hyperlipidemia: On a statin that he is continuing.  Patient has severe systemic disease due to COVID-19.  He has electrolyte abnormality hypoxia.  He will need admission and close monitoring as well as daily biomarkers.  Anticipate hospital admission more than 2 midnights.  DVT prophylaxis: Lovenox subcu Code Status: Full code Family Communication: None Disposition Plan: Home after hospitalization Consults called: None Admission status: Inpatient.   Barb Merino MD [facility] Pager [phone]  If 7PM-7AM, please contact night-coverage [email or web address] Password [facility]  02/21/2019, 4:08 PM

## 2019-02-21 NOTE — ED Notes (Signed)
Pts daughter, Mickel Baas, dropped off Pensions consultant for patient- Games developer given to patient.

## 2019-02-21 NOTE — ED Provider Notes (Signed)
[facility] DEPT Provider Note   CSN: [id number] Arrival date & time: 02/21/19  1058    History   Chief Complaint No chief complaint on file.   HPI Brandon Farrell is a 58 y.o. male with history of cholelithiasis, HLD, HTN, arthritis presents for evaluation of acute onset, progressively worsening COVID-19 symptoms for 9 days.  Reports his wife and daughter have both tested positive but he had a test that was negative although he is symptomatic.  Reports fever up to 103 F at home, generalized abdominal pain for the first few days of symptoms but none at this time.  Also reports fatigue, generalized myalgias, nonproductive cough and shortness of breath which worsens with exertion and when his fever worsens.  Denies chest pain.  He has had significant nausea but no vomiting.  Has had several episodes of nonbloody watery diarrhea daily.  Denies urinary symptoms.  He is a non-smoker.  Has been taking ibuprofen and Tylenol at home for his fever with some relief.  He is primarily Spanish-speaking and a translator was used at the encounter.     The history is provided by the patient. The history is limited by a language barrier. A language interpreter was used.    Past Medical History:  Diagnosis Date  . Arthritis    left knee  . Cataract   . Cholelithiasis   . Hypercholesterolemia   . Hypertension    no per pt  . Palpitations    a. 03/2012 Ex MV: No isch/infarct;  b. 04/2012 Echo: EF 55-65%, mildly dil LA.  Marland Kitchen Vertigo     Patient Active Problem List   Diagnosis Date Noted  . Pneumonia due to COVID-19 virus 02/21/2019  . AKI (acute kidney injury) ([place]) 02/21/2019  . External hemorrhoids 08/18/2018  . Difficulty swallowing 11/30/2017  . Post herpetic neuralgia 05/18/2017  . Left lateral knee pain 04/01/2017  . Hyperlipidemia 10/02/2014  . Palpitations 03/22/2014  . Arthritis 12/22/2013  . Family history of MI (myocardial infarction) 03/23/2012  .  HYPERTRIGLYCERIDEMIA 06/18/2007  . HYPERLIPIDEMIA 06/18/2007  . OBESITY 06/11/2007  . GERD 06/11/2007  . VITILIGO 06/11/2007    Past Surgical History:  Procedure Laterality Date  . [place]   left  . LAPAROSCOPIC CHOLECYSTECTOMY  april 2013        Home Medications    Prior to Admission medications   Medication Sig Start Date End Date Taking? Authorizing Provider  acetaminophen (TYLENOL) 500 MG tablet Take 500 mg by mouth every 6 (six) hours as needed for fever or headache.   Yes [provider]  atorvastatin (LIPITOR) 20 MG tablet TAKE 1 TABLET (20 MG TOTAL) BY MOUTH DAILY AT 6 PM. Patient taking differently: Take 20 mg by mouth daily.  10/19/18  Yes Nahser, Wonda Cheng, MD  cholecalciferol (VITAMIN D3) 25 MCG (1000 UT) tablet Take 1,000 Units by mouth daily.   Yes [provider]  Choline Fenofibrate (FENOFIBRIC ACID) 135 MG CPDR TAKE 1 CAPSULE BY MOUTH EVERY DAY 10/06/18  Yes Lucille Passy, MD  ibuprofen (ADVIL) 200 MG tablet Take 400 mg by mouth every 6 (six) hours as needed for fever.   Yes [provider]    Family History Family History  Problem Relation Age of Onset  . Heart disease Son   . Colon cancer Neg Hx   . Stomach cancer Neg Hx   . Esophageal cancer Neg Hx   . Rectal cancer Neg Hx  Social History Social History   Tobacco Use  . Smoking status: Never Smoker  . Smokeless tobacco: Never Used  Substance Use Topics  . Alcohol use: Yes    Alcohol/week: 3.0 standard drinks    Types: 3 Cans of beer per week    Comment: occasional  . Drug use: No     Allergies   Patient has no known allergies.   Review of Systems Review of Systems  Constitutional: Positive for chills and fever.  Respiratory: Positive for cough and shortness of breath.   Cardiovascular: Negative for chest pain.  Gastrointestinal: Positive for abdominal pain (resolved), diarrhea and nausea. Negative for constipation and vomiting.  Genitourinary:  Negative for dysuria, frequency, hematuria and urgency.  All other systems reviewed and are negative.    Physical Exam Updated Vital Signs BP 131/78 (BP Location: Left Arm)   Pulse 83   Temp 99.4 F (37.4 C) (Oral)   Resp 20   Ht 5\' 2"  (1.575 m)   Wt 88 kg   SpO2 92%   BMI 35.48 kg/m   Physical Exam Vitals signs and nursing note reviewed.  Constitutional:      General: He is not in acute distress.    Appearance: He is well-developed.  HENT:     Head: Normocephalic and atraumatic.  Eyes:     General:        Right eye: No discharge.        Left eye: No discharge.     Conjunctiva/sclera: Conjunctivae normal.  Neck:     Musculoskeletal: Normal range of motion and neck supple.     Vascular: No JVD.     Trachea: No tracheal deviation.  Cardiovascular:     Rate and Rhythm: Normal rate and regular rhythm.     Pulses: Normal pulses.     Heart sounds: Normal heart sounds.  Pulmonary:     Effort: Tachypnea present.     Breath sounds: Normal breath sounds.     Comments: Speaking in full sentences without difficulty.  Bibasilar crackles noted, occasional dry cough Abdominal:     General: Abdomen is flat. Bowel sounds are normal. There is no distension.     Palpations: Abdomen is soft.     Tenderness: There is no abdominal tenderness. There is no guarding or rebound.  Skin:    General: Skin is warm and dry.     Findings: No erythema.  Neurological:     Mental Status: He is alert.  Psychiatric:        Behavior: Behavior normal.      ED Treatments / Results  Labs (all labs ordered are listed, but only abnormal results are displayed) Labs Reviewed  SARS CORONAVIRUS 2 (HOSPITAL ORDER, [place] [facility]) - Abnormal; Notable for the following components:      Result Value   SARS Coronavirus 2 POSITIVE (*)    All other components within normal limits  COMPREHENSIVE METABOLIC PANEL - Abnormal; Notable for the following components:   Glucose, Bld 108 (*)     BUN 23 (*)    Creatinine, Ser 1.50 (*)    Total Protein 8.3 (*)    Total Bilirubin 1.3 (*)    GFR calc non Af Amer 50 (*)    GFR calc Af Amer 58 (*)    All other components within normal limits  CBC WITH DIFFERENTIAL/PLATELET - Abnormal; Notable for the following components:   WBC 12.8 (*)    Neutro Abs 10.9 (*)  Abs Immature Granulocytes 0.15 (*)    All other components within normal limits  BRAIN NATRIURETIC PEPTIDE - Abnormal; Notable for the following components:   B Natriuretic Peptide 104.9 (*)    All other components within normal limits  C-REACTIVE PROTEIN - Abnormal; Notable for the following components:   CRP 30.9 (*)    All other components within normal limits  FERRITIN - Abnormal; Notable for the following components:   Ferritin 1,527 (*)    All other components within normal limits  C-REACTIVE PROTEIN - Abnormal; Notable for the following components:   CRP 27.0 (*)    All other components within normal limits  FERRITIN - Abnormal; Notable for the following components:   Ferritin 1,473 (*)    All other components within normal limits  FIBRINOGEN - Abnormal; Notable for the following components:   Fibrinogen >800 (*)    All other components within normal limits  LACTATE DEHYDROGENASE - Abnormal; Notable for the following components:   LDH 227 (*)    All other components within normal limits  D-DIMER, QUANTITATIVE (NOT AT [facility]) - Abnormal; Notable for the following components:   D-Dimer, Quant 0.71 (*)    All other components within normal limits  CBC WITH DIFFERENTIAL/PLATELET - Abnormal; Notable for the following components:   Abs Immature Granulocytes 0.08 (*)    All other components within normal limits  COMPREHENSIVE METABOLIC PANEL - Abnormal; Notable for the following components:   Glucose, Bld 111 (*)    BUN 25 (*)    Calcium 8.7 (*)    Albumin 3.1 (*)    AST 45 (*)    All other components within normal limits  C-REACTIVE PROTEIN - Abnormal;  Notable for the following components:   CRP 20.5 (*)    All other components within normal limits  D-DIMER, QUANTITATIVE (NOT AT [facility]) - Abnormal; Notable for the following components:   D-Dimer, Quant 0.63 (*)    All other components within normal limits  FERRITIN - Abnormal; Notable for the following components:   Ferritin 1,558 (*)    All other components within normal limits  PHOSPHORUS - Abnormal; Notable for the following components:   Phosphorus 1.6 (*)    All other components within normal limits  CULTURE, BLOOD (ROUTINE X 2)  CULTURE, BLOOD (ROUTINE X 2)  EXPECTORATED SPUTUM ASSESSMENT W REFEX TO RESP CULTURE  HIV ANTIBODY (ROUTINE TESTING W REFLEX)  PROCALCITONIN  CK  MAGNESIUM    EKG EKG Interpretation  Date/Time:  Monday February 21 2019 11:01:24 EDT Ventricular Rate:  71 PR Interval:    QRS Duration: 101 QT Interval:  402 QTC Calculation: 437 R Axis:   80 Text Interpretation:  Sinus rhythm Normal ECG Confirmed by Molpus, John [phone]) on 02/22/2019 12:11:03 PM   Radiology Dg Chest Portable 1 View  Result Date: 02/21/2019 CLINICAL DATA:  Worsening fever, weakness, covid symptoms x 2 weeks, states 2 family members tested positive EXAM: PORTABLE CHEST 1 VIEW COMPARISON:  None. FINDINGS: Heart size is accentuated by technique and normal. There is faint patchy opacity at the LEFT lung base and the RIGHT lung apex no pulmonary edema. IMPRESSION: Multifocal infiltrates. Electronically Signed   By: [name] Nations M.D.   On: 02/21/2019 12:10    Procedures .Critical Care Performed by: Renita Papa, PA-C Authorized by: Renita Papa, PA-C   Critical care provider statement:    Critical care time (minutes):  35   Critical care was necessary to treat or prevent imminent  or life-threatening deterioration of the following conditions:  Respiratory failure and renal failure   Critical care was time spent personally by me on the following activities:  Discussions with  consultants, evaluation of patient's response to treatment, examination of patient, ordering and performing treatments and interventions, ordering and review of laboratory studies, ordering and review of radiographic studies, pulse oximetry, re-evaluation of patient's condition, obtaining history from patient or surrogate and review of old charts   I assumed direction of critical care for this patient from another provider in my specialty: no     (including critical care time)  Medications Ordered in ED Medications  ipratropium (ATROVENT HFA) inhaler 2 puff (2 puffs Inhalation Given 02/22/19 0740)  guaiFENesin-dextromethorphan (ROBITUSSIN DM) 100-10 MG/5ML syrup 10 mL (has no administration in time range)  0.9 %  sodium chloride infusion ( Intravenous New Bag/Given 02/22/19 0938)  acetaminophen (TYLENOL) tablet 500 mg (has no administration in time range)  atorvastatin (LIPITOR) tablet 20 mg (20 mg Oral Given 02/22/19 0938)  cholecalciferol (VITAMIN D3) tablet 1,000 Units (1,000 Units Oral Given 02/22/19 0938)  enoxaparin (LOVENOX) injection 40 mg (40 mg Subcutaneous Given 02/22/19 0105)  albuterol (VENTOLIN HFA) 108 (90 Base) MCG/ACT inhaler 2 puff (2 puffs Inhalation Given 02/22/19 0743)  ondansetron (ZOFRAN) tablet 4 mg (has no administration in time range)    Or  ondansetron (ZOFRAN) injection 4 mg (has no administration in time range)  dexamethasone (DECADRON) injection 8 mg (8 mg Intravenous Given 02/22/19 0939)  remdesivir 200 mg in sodium chloride 0.9 % 250 mL IVPB (200 mg Intravenous New Bag/Given 02/22/19 1100)    Followed by  remdesivir 100 mg in sodium chloride 0.9 % 250 mL IVPB (has no administration in time range)  MEDLINE mouth rinse (has no administration in time range)  ondansetron (ZOFRAN) injection 4 mg (4 mg Intravenous Given 02/21/19 1236)  acetaminophen (TYLENOL) tablet 1,000 mg (1,000 mg Oral Given 02/21/19 1500)  dexamethasone (DECADRON) injection 8 mg (8 mg Intravenous Given  02/21/19 2320)     Initial Impression / Assessment and Plan / ED Course  I have reviewed the triage vital signs and the nursing notes.  Pertinent labs & imaging results that were available during my care of the patient were reviewed by me and considered in my medical decision making (see chart for details).        Brandon Farrell was evaluated in Emergency Department on 02/22/2019 for the symptoms described in the history of present illness. He was evaluated in the context of the global COVID-19 pandemic, which necessitated consideration that the patient might be at risk for infection with the SARS-CoV-2 virus that causes COVID-19. Institutional protocols and algorithms that pertain to the evaluation of patients at risk for COVID-19 are in a state of rapid change based on information released by regulatory bodies including the CDC and federal and state organizations. These policies and algorithms were followed during the patient's care in the ED.  Patient with likely COVID-19 infection presenting to the ED for evaluation of progressively worsening shortness of breath, myalgias, generalized weakness, decreased oral intake, diarrhea.  Afebrile in the ED but recently had Motrin prior to arrival.  Uncomfortable but nonseptic in appearance.  SPO2 saturations low normal and he is tachypneic so was placed on supplemental oxygen for comfort with improvement.  Chest x-ray shows multifocal pneumonia consistent with COVID-19.  He had a negative test at his PCPs office but likely this is a false negative as both his wife and daughter have  it and he is currently symptomatic.  Lab work reviewed by me shows a leukocytosis with elevation in absolute neutrophils, no anemia.  His BUN and creatinine are elevated consistent with AK I likely secondary to dehydration.  No metabolic derangements.  Doubt ACS/MI in the absence of chest pain.  Doubt PE.  Given increased work of breathing and AKI will likely require admission for  suspected COVID-19 infection.  His test is still in process, though I anticipate it will be positive.  Spoke with Dr. Sloan Leiter with [facility] who agrees to assume care of patient and bring him into the hospital for further evaluation and management.  Final Clinical Impressions(s) / ED Diagnoses   Final diagnoses:  AKI (acute kidney injury) ([place])  COVID-19 virus infection    ED Discharge Orders    None       Debroah Baller 02/22/19 1228    Charlesetta Shanks, MD 02/26/19 1424

## 2019-02-21 NOTE — ED Notes (Signed)
Report given to Webb Silversmith, RN at [place]

## 2019-02-21 NOTE — Telephone Encounter (Signed)
Able to reach daughter, Mickel Baas, by phone, and advised to keep pt. in car, once arrived at hospital; instructed to call [phone], to let staff know he has arrived, and they will come out to car to get him.  Verb. Understanding.

## 2019-02-21 NOTE — ED Notes (Signed)
Please call Mickel Baas: [phone].

## 2019-02-21 NOTE — ED Notes (Signed)
Per patient engagement, states patient is Covid Pos-states symptoms getting worse-unable to keep fever down with tylenol and Advil-T 102.4-symptoms include headache, GI issues, fatigue

## 2019-02-22 DIAGNOSIS — N179 Acute kidney failure, unspecified: Secondary | ICD-10-CM

## 2019-02-22 DIAGNOSIS — E782 Mixed hyperlipidemia: Secondary | ICD-10-CM

## 2019-02-22 LAB — PHOSPHORUS: Phosphorus: 1.6 mg/dL — ABNORMAL LOW (ref 2.5–4.6)

## 2019-02-22 LAB — COMPREHENSIVE METABOLIC PANEL
ALT: 40 U/L (ref 0–44)
AST: 45 U/L — ABNORMAL HIGH (ref 15–41)
Albumin: 3.1 g/dL — ABNORMAL LOW (ref 3.5–5.0)
Alkaline Phosphatase: 86 U/L (ref 38–126)
Anion gap: 10 (ref 5–15)
BUN: 25 mg/dL — ABNORMAL HIGH (ref 6–20)
CO2: 22 mmol/L (ref 22–32)
Calcium: 8.7 mg/dL — ABNORMAL LOW (ref 8.9–10.3)
Chloride: 105 mmol/L (ref 98–111)
Creatinine, Ser: 1.11 mg/dL (ref 0.61–1.24)
GFR calc Af Amer: >60 mL/min (ref >60)
GFR calc non Af Amer: >60 mL/min (ref >60)
Glucose, Bld: 111 mg/dL — ABNORMAL HIGH (ref 70–99)
Potassium: 4.3 mmol/L (ref 3.5–5.1)
Sodium: 137 mmol/L (ref 135–145)
Total Bilirubin: 0.7 mg/dL (ref 0.3–1.2)
Total Protein: 6.8 g/dL (ref 6.5–8.1)

## 2019-02-22 LAB — CBC WITH DIFFERENTIAL/PLATELET
Abs Immature Granulocytes: 0.08 10*3/uL — ABNORMAL HIGH (ref 0.00–0.07)
Basophils Absolute: 0 10*3/uL (ref 0.0–0.1)
Basophils Relative: 0 %
Eosinophils Absolute: 0 10*3/uL (ref 0.0–0.5)
Eosinophils Relative: 0 %
HCT: 40 % (ref 39.0–52.0)
Hemoglobin: 13.2 g/dL (ref 13.0–17.0)
Immature Granulocytes: 1 %
Lymphocytes Relative: 9 %
Lymphs Abs: 0.8 10*3/uL (ref 0.7–4.0)
MCH: 29.7 pg (ref 26.0–34.0)
MCHC: 33 g/dL (ref 30.0–36.0)
MCV: 89.9 fL (ref 80.0–100.0)
Monocytes Absolute: 0.2 10*3/uL (ref 0.1–1.0)
Monocytes Relative: 3 %
Neutro Abs: 7.7 10*3/uL (ref 1.7–7.7)
Neutrophils Relative %: 87 %
Platelets: 263 10*3/uL (ref 150–400)
RBC: 4.45 MIL/uL (ref 4.22–5.81)
RDW: 13.2 % (ref 11.5–15.5)
WBC: 8.8 10*3/uL (ref 4.0–10.5)
nRBC: 0 % (ref 0.0–0.2)

## 2019-02-22 LAB — FERRITIN: Ferritin: 1558 ng/mL — ABNORMAL HIGH (ref 24–336)

## 2019-02-22 LAB — CK: Total CK: 79 U/L (ref 49–397)

## 2019-02-22 LAB — HIV ANTIBODY (ROUTINE TESTING W REFLEX): HIV Screen 4th Generation wRfx: NONREACTIVE

## 2019-02-22 LAB — C-REACTIVE PROTEIN: CRP: 20.5 mg/dL — ABNORMAL HIGH (ref <1.0)

## 2019-02-22 LAB — D-DIMER, QUANTITATIVE: D-Dimer, Quant: 0.63 ug/mL-FEU — ABNORMAL HIGH (ref 0.00–0.50)

## 2019-02-22 LAB — MAGNESIUM: Magnesium: 2.1 mg/dL (ref 1.7–2.4)

## 2019-02-22 MED ORDER — METHYLPREDNISOLONE SODIUM SUCC 40 MG IJ SOLR
40.0000 mg | Freq: Three times a day (TID) | INTRAMUSCULAR | Status: DC
  Administered 2019-02-22 – 2019-02-23 (×4): 40 mg via INTRAVENOUS
  Filled 2019-02-22 (×4): qty 1

## 2019-02-22 MED ORDER — TOCILIZUMAB 400 MG/20ML IV SOLN
700.0000 mg | Freq: Once | INTRAVENOUS | Status: AC
  Administered 2019-02-22: 700 mg via INTRAVENOUS
  Filled 2019-02-22: qty 35

## 2019-02-22 MED ORDER — SODIUM CHLORIDE 0.9 % IV SOLN
100.0000 mg | INTRAVENOUS | Status: AC
  Administered 2019-02-23 – 2019-02-26 (×4): 100 mg via INTRAVENOUS
  Filled 2019-02-22 (×4): qty 20

## 2019-02-22 MED ORDER — SODIUM CHLORIDE 0.9 % IV SOLN
200.0000 mg | Freq: Once | INTRAVENOUS | Status: AC
  Administered 2019-02-22: 200 mg via INTRAVENOUS
  Filled 2019-02-22: qty 40

## 2019-02-22 MED ORDER — SODIUM CHLORIDE 0.9 % IV SOLN
INTRAVENOUS | Status: DC
  Administered 2019-02-22 – 2019-02-24 (×4): via INTRAVENOUS

## 2019-02-22 MED ORDER — ORAL CARE MOUTH RINSE
15.0000 mL | Freq: Two times a day (BID) | OROMUCOSAL | Status: DC
  Administered 2019-02-22 – 2019-02-26 (×9): 15 mL via OROMUCOSAL

## 2019-02-22 NOTE — Progress Notes (Signed)
Telephone call to patient's contact, no answer, will try again later.

## 2019-02-22 NOTE — Telephone Encounter (Signed)
Addendum: Dx with Pneumonia due to COVID/thx dmf

## 2019-02-22 NOTE — Telephone Encounter (Signed)
TA-Plz see triage note/he was advised to go go ED/febrile 103/multiple Sx/thx dmf

## 2019-02-22 NOTE — Progress Notes (Signed)
Remdesivir - Pharmacy Brief Note   O:  ALT: 40 CXR: Multifocal infiltrates. SpO2: 92% on 2L [place] O2   A/P:  Remdesivir 200 mg once followed by 100 mg daily x 4 days.   Gretta Arab PharmD, BCPS Clinical pharmacist phone 7am- 5pm: [phone] 02/22/2019 9:11 AM

## 2019-02-22 NOTE — Progress Notes (Signed)
PROGRESS NOTE    Brandon Farrell  LGX:[id number] DOB: 12-20-1960 DOA: 02/21/2019 PCP: Lucille Passy, MD   Brief Narrative:  Brandon Farrell is a 58 y.o. Hispanic male (Spanish speaker) PMHx HTN, HLD,, postherpetic neuralgia,  Presenting to the hospital with about 10 days of not feeling well, fever and shortness of breath and recently exacerbating symptoms in the context of multiple family members sick with COVID-19.  Patient is Spanish-speaking.  Interpreter was used.  Patient's wife and daughter were tested positive for COVID-19 about 10 days ago, patient also had symptoms of fever and abdominal pain, he was tested and that turned out to be negative, however he had symptoms so he was asked to quadrant time at home.  He was staying home.  His fever has slightly come down, abdominal pain improved, however he continues to have fatigue, generalized myalgia, cough with no sputum production, shortness of breath with exertion.  Has nausea but no vomiting.  He had several episodes of nonbloody watery diarrhea for last few days.  No urinary symptoms.  Patient has been taking ibuprofen and Tylenol at home with some relief.  Difficulty breathing with exertion and extreme fatigue was very concerning so he came to the emergency room after calling his primary care physician. ED Course: Patient is afebrile, taken ibuprofen before coming to the hospital. 91% on room air and started on 2 liters. WBC is 12.8. creatinine 1.5.  cxr shows LLL infiltrates .    Subjective: A/O x4, positive S OB, negative CP, negative abdominal pain, negative N/V   Assessment & Plan:   Principal Problem:   Pneumonia due to COVID-19 virus Active Problems:   HYPERLIPIDEMIA   GERD   AKI (acute kidney injury) ([place])   Acute respiratory failure with hypoxia/pneumonia due to COVID 19   Recent Labs  Lab 02/21/19 1442 02/21/19 1451 02/22/19 0230  CRP 30.9* 27.0* 20.5*   Recent Labs  Lab 02/21/19 1451 02/22/19 0230  DDIMER 0.71*  0.63*   -Currently 2 L O2 via [place], SPO2 92% -Patient with new onset O2 demand plus multifocal infiltrates on PCXR meets criteria Remdesivir per pharmacy protocol - Patient CVP> 7 meets criteria for Actemra. Patient with no history of hepatitis, tuberculosis, or use of immunosuppressant medication. - Bronchodilators continue - Flutter valve - Mucolytic's - Titrate O2 to maintain SPO2> 93%  - Solu-Medrol 40 mg TID  AKI -Normal saline 82ml/hr - Hold all nephrotoxic medication - Strict in and out -Daily weight  HLD -Lipitor 20 mg daily - Lipid panel pending     DVT prophylaxis: Lovenox Code Status: Full Family Communication: None Disposition Plan: TBD   Consultants:  None  Procedures/Significant Events:  7/27 PCXR: Multifocal infiltrates   I have personally reviewed and interpreted all radiology studies and my findings are as above.  VENTILATOR SETTINGS:    Cultures   Antimicrobials: 7/27 blood RIGHT hand NGTD 7/27 blood RIGHT antecubital NGTD 7/27 SARS coronavirus positive 7/27 HIV negative 7/28 acute hepatitis panel pending   Devices    LINES / TUBES:      Continuous Infusions: . sodium chloride 100 mL/hr at 02/21/19 2247     Objective: Vitals:   02/21/19 2215 02/21/19 2300 02/22/19 0428 02/22/19 0731  BP: 133/82  (!) 141/76 131/78  Pulse: 79  96 83  Resp: 20  18 20   Temp: (!) 97.5 F (36.4 C)  98.6 F (37 C) 99.4 F (37.4 C)  TempSrc: Oral  Oral Oral  SpO2: 94%  94% 92%  Weight:  88 kg    Height:  5\' 2"  (1.575 m)      Intake/Output Summary (Last 24 hours) at 02/22/2019 0850 Last data filed at 02/22/2019 0600 Gross per 24 hour  Intake 1369.7 ml  Output 650 ml  Net 719.7 ml   Filed Weights   02/21/19 2300  Weight: 88 kg    Examination:  General: A/O x4, positive acute respiratory distress Currently 2 L O2 via [place], SPO2 92% Eyes: negative scleral hemorrhage, negative anisocoria, negative icterus ENT: Negative Runny nose,  negative gingival bleeding, Neck:  Negative scars, masses, torticollis, lymphadenopathy, JVD Lungs: Clear to auscultation bilaterally without wheezes or crackles Cardiovascular: Regular rate and rhythm without murmur gallop or rub normal S1 and S2 Abdomen: negative abdominal pain, nondistended, positive soft, bowel sounds, no rebound, no ascites, no appreciable mass Extremities: No significant cyanosis, clubbing, or edema bilateral lower extremities Skin: Negative rashes, lesions, ulcers Psychiatric:  Negative depression, negative anxiety, negative fatigue, negative mania  Central nervous system:  Cranial nerves II through XII intact, tongue/uvula midline, all extremities muscle strength 5/5, sensation intact throughout, negative dysarthria, negative expressive aphasia, negative receptive aphasia.  .     Data Reviewed: Care during the described time interval was provided by me .  I have reviewed this patient's available data, including medical history, events of note, physical examination, and all test results as part of my evaluation.   CBC: Recent Labs  Lab 02/21/19 1135 02/22/19 0230  WBC 12.8* 8.8  NEUTROABS 10.9* 7.7  HGB 15.2 13.2  HCT 45.3 40.0  MCV 89.2 89.9  PLT 248 341   Basic Metabolic Panel: Recent Labs  Lab 02/21/19 1135 02/22/19 0230  NA 137 137  K 3.9 4.3  CL 101 105  CO2 24 22  GLUCOSE 108* 111*  BUN 23* 25*  CREATININE 1.50* 1.11  CALCIUM 9.2 8.7*  MG  --  2.1  PHOS  --  1.6*   GFR: Estimated Creatinine Clearance: 68.9 mL/min (by C-G formula based on SCr of 1.11 mg/dL). Liver Function Tests: Recent Labs  Lab 02/21/19 1135 02/22/19 0230  AST 41 45*  ALT 41 40  ALKPHOS 97 86  BILITOT 1.3* 0.7  PROT 8.3* 6.8  ALBUMIN 3.9 3.1*   No results for input(s): LIPASE, AMYLASE in the last 168 hours. No results for input(s): AMMONIA in the last 168 hours. Coagulation Profile: No results for input(s): INR, PROTIME in the last 168 hours. Cardiac  Enzymes: Recent Labs  Lab 02/22/19 0230  CKTOTAL 79   BNP (last 3 results) No results for input(s): PROBNP in the last 8760 hours. HbA1C: No results for input(s): HGBA1C in the last 72 hours. CBG: No results for input(s): GLUCAP in the last 168 hours. Lipid Profile: No results for input(s): CHOL, HDL, LDLCALC, TRIG, CHOLHDL, LDLDIRECT in the last 72 hours. Thyroid Function Tests: No results for input(s): TSH, T4TOTAL, FREET4, T3FREE, THYROIDAB in the last 72 hours. Anemia Panel: Recent Labs    02/21/19 1451 02/22/19 0230  FERRITIN 1,473* 1,558*   Urine analysis:    Component Value Date/Time   COLORURINE Yellow 11/24/2011 2203   COLORURINE yellow 08/09/2009 0858   APPEARANCEUR Clear 11/24/2011 2203   LABSPEC 1.019 11/24/2011 2203   PHURINE 6.0 11/24/2011 2203   PHURINE 6.5 08/09/2009 0858   GLUCOSEU Negative 11/24/2011 2203   HGBUR Negative 11/24/2011 2203   HGBUR moderate 08/09/2009 0858   BILIRUBINUR negative 10/09/2016 1302   BILIRUBINUR Negative 11/24/2011 2203   KETONESUR Negative  11/24/2011 2203   PROTEINUR negative 10/09/2016 1302   PROTEINUR Negative 11/24/2011 2203   UROBILINOGEN 0.2 10/09/2016 1302   UROBILINOGEN 0.2 08/09/2009 0858   NITRITE negative 10/09/2016 1302   NITRITE Negative 11/24/2011 2203   NITRITE negative 08/09/2009 0858   LEUKOCYTESUR Negative 11/24/2011 2203   Sepsis Labs: @LABRCNTIP (procalcitonin:4,lacticidven:4)  ) Recent Results (from the past 240 hour(s))  SARS Coronavirus 2 (CEPHEID- Performed in [place] hospital lab), Hosp Order     Status: Abnormal   Collection Time: 02/21/19  2:07 PM   Specimen: Nasopharyngeal Swab  Result Value Ref Range Status   SARS Coronavirus 2 POSITIVE (A) NEGATIVE Final    Comment: RESULT CALLED TO, READ BACK BY AND VERIFIED WITH: Tsosie Billing [id number] @ [place] (NOTE) If result is NEGATIVE SARS-CoV-2 target nucleic acids are NOT DETECTED. The SARS-CoV-2 RNA is generally detectable  in upper and lower  respiratory specimens during the acute phase of infection. The lowest  concentration of SARS-CoV-2 viral copies this assay can detect is 250  copies / mL. A negative result does not preclude SARS-CoV-2 infection  and should not be used as the sole basis for treatment or other  patient management decisions.  A negative result may occur with  improper specimen collection / handling, submission of specimen other  than nasopharyngeal swab, presence of viral mutation(s) within the  areas targeted by this assay, and inadequate number of viral copies  (<250 copies / mL). A negative result must be combined with clinical  observations, patient history, and epidemiological information. If result is POSITIVE SARS-CoV-2 target nucleic acids are DET ECTED. The SARS-CoV-2 RNA is generally detectable in upper and lower  respiratory specimens during the acute phase of infection.  Positive  results are indicative of active infection with SARS-CoV-2.  Clinical  correlation with patient history and other diagnostic information is  necessary to determine patient infection status.  Positive results do  not rule out bacterial infection or co-infection with other viruses. If result is PRESUMPTIVE POSTIVE SARS-CoV-2 nucleic acids MAY BE PRESENT.   A presumptive positive result was obtained on the submitted specimen  and confirmed on repeat testing.  While 2019 novel coronavirus  (SARS-CoV-2) nucleic acids may be present in the submitted sample  additional confirmatory testing may be necessary for epidemiological  and / or clinical management purposes  to differentiate between  SARS-CoV-2 and other Sarbecovirus currently known to infect humans.  If clinically indicated additional testing with an alternate test  methodology ([place]) is advised. The SARS-CoV-2 RNA is generally  detectable in upper and lower respiratory specimens during the acute  phase of infection. The expected result is  Negative. Fact Sheet for Patients:  StrictlyIdeas.no Fact Sheet for Healthcare Providers: BankingDealers.co.za This test is not yet approved or cleared by the [place] FDA and has been authorized for detection and/or diagnosis of SARS-CoV-2 by FDA under an Emergency Use Authorization (EUA).  This EUA will remain in effect (meaning this test can be used) for the duration of the COVID-19 declaration under Section 564(b)(1) of the Act, 21 U.S.C. section 360bbb-3(b)(1), unless the authorization is terminated or revoked sooner. Performed at [facility], [place] [address] [place], [place] [address]   Culture, blood (Routine X 2) w Reflex to ID Panel     Status: None (Preliminary result)   Collection Time: 02/21/19  2:43 PM   Specimen: BLOOD RIGHT HAND  Result Value Ref Range Status   Specimen Description   Final  BLOOD RIGHT HAND Performed at [facility], [place] [address], [place], [place] [address]    Special Requests   Final    BOTTLES DRAWN AEROBIC AND ANAEROBIC Blood Culture adequate volume Performed at [place] [address] [place], [place] [address]    Culture   Final    NO GROWTH < 12 HOURS Performed at [facility] [address] [place], [place] [address]    Report Status PENDING  Incomplete  Culture, blood (Routine X 2) w Reflex to ID Panel     Status: None (Preliminary result)   Collection Time: 02/21/19  2:48 PM   Specimen: BLOOD  Result Value Ref Range Status   Specimen Description   Final    BLOOD RIGHT ANTECUBITAL Performed at [facility], [place] [address], [place], [place] [address]    Special Requests   Final    BOTTLES DRAWN AEROBIC AND ANAEROBIC Blood Culture adequate volume Performed at [facility] [place] [address] [place], [place] [address]    Culture   Final    NO GROWTH < 12 HOURS Performed at [place] [address] [place], [place] [address]    Report Status PENDING  Incomplete         Radiology Studies: Dg Chest Portable 1 View  Result Date: 02/21/2019 CLINICAL DATA:  Worsening fever, weakness, covid symptoms x 2 weeks, states 2 family members tested positive EXAM: PORTABLE CHEST 1 VIEW COMPARISON:  None. FINDINGS: Heart size is accentuated by technique and normal. There is faint patchy opacity at the LEFT lung base and the RIGHT lung apex no pulmonary edema. IMPRESSION: Multifocal infiltrates. Electronically Signed   By: [name] Nations M.D.   On: 02/21/2019 12:10        Scheduled Meds: . albuterol  2 puff Inhalation Q6H  . atorvastatin  20 mg Oral Daily  . cholecalciferol  1,000 Units Oral Daily  . dexamethasone (DECADRON) injection  8 mg Intravenous Q24H  . enoxaparin (LOVENOX) injection  40 mg Subcutaneous QHS  . ipratropium  2 puff Inhalation Q6H   Continuous Infusions: . sodium chloride 100 mL/hr at 02/21/19 2247     LOS: 1 day   The patient is critically ill with multiple organ systems failure and requires high complexity decision making for assessment and support, frequent evaluation and titration of therapies, application of advanced monitoring technologies and extensive interpretation of multiple databases. Critical Care Time devoted to patient care services described in this note  Time spent: 40 minutes     Bryndle Corredor, Geraldo Docker, MD [facility] Pager [phone]  If 7PM-7AM, please contact night-coverage [email or web address] Password [facility] 02/22/2019, 8:50 AM

## 2019-02-23 LAB — COMPREHENSIVE METABOLIC PANEL
ALT: 67 U/L — ABNORMAL HIGH (ref 0–44)
AST: 64 U/L — ABNORMAL HIGH (ref 15–41)
Albumin: 2.8 g/dL — ABNORMAL LOW (ref 3.5–5.0)
Alkaline Phosphatase: 74 U/L (ref 38–126)
Anion gap: 10 (ref 5–15)
BUN: 21 mg/dL — ABNORMAL HIGH (ref 6–20)
CO2: 21 mmol/L — ABNORMAL LOW (ref 22–32)
Calcium: 8.7 mg/dL — ABNORMAL LOW (ref 8.9–10.3)
Chloride: 108 mmol/L (ref 98–111)
Creatinine, Ser: 0.86 mg/dL (ref 0.61–1.24)
GFR calc Af Amer: >60 mL/min (ref >60)
GFR calc non Af Amer: >60 mL/min (ref >60)
Glucose, Bld: 130 mg/dL — ABNORMAL HIGH (ref 70–99)
Potassium: 4 mmol/L (ref 3.5–5.1)
Sodium: 139 mmol/L (ref 135–145)
Total Bilirubin: 0.4 mg/dL (ref 0.3–1.2)
Total Protein: 6.3 g/dL — ABNORMAL LOW (ref 6.5–8.1)

## 2019-02-23 LAB — CK: Total CK: 70 U/L (ref 49–397)

## 2019-02-23 LAB — HEPATITIS PANEL, ACUTE
HCV Ab: 0.1 s/co ratio (ref 0.0–0.9)
Hep A IgM: NEGATIVE
Hep B C IgM: NEGATIVE
Hepatitis B Surface Ag: NEGATIVE

## 2019-02-23 LAB — PHOSPHORUS: Phosphorus: 2.2 mg/dL — ABNORMAL LOW (ref 2.5–4.6)

## 2019-02-23 LAB — LIPID PANEL
Cholesterol: 104 mg/dL (ref 0–200)
HDL: 12 mg/dL — ABNORMAL LOW (ref >40)
LDL Cholesterol: 67 mg/dL (ref 0–99)
Total CHOL/HDL Ratio: 8.7 RATIO
Triglycerides: 126 mg/dL (ref <150)
VLDL: 25 mg/dL (ref 0–40)

## 2019-02-23 LAB — CBC WITH DIFFERENTIAL/PLATELET
Abs Immature Granulocytes: 0.09 10*3/uL — ABNORMAL HIGH (ref 0.00–0.07)
Basophils Absolute: 0 10*3/uL (ref 0.0–0.1)
Basophils Relative: 0 %
Eosinophils Absolute: 0 10*3/uL (ref 0.0–0.5)
Eosinophils Relative: 0 %
HCT: 37.6 % — ABNORMAL LOW (ref 39.0–52.0)
Hemoglobin: 12.3 g/dL — ABNORMAL LOW (ref 13.0–17.0)
Immature Granulocytes: 1 %
Lymphocytes Relative: 13 %
Lymphs Abs: 1.1 10*3/uL (ref 0.7–4.0)
MCH: 29.5 pg (ref 26.0–34.0)
MCHC: 32.7 g/dL (ref 30.0–36.0)
MCV: 90.2 fL (ref 80.0–100.0)
Monocytes Absolute: 0.3 10*3/uL (ref 0.1–1.0)
Monocytes Relative: 4 %
Neutro Abs: 7.2 10*3/uL (ref 1.7–7.7)
Neutrophils Relative %: 82 %
Platelets: 280 10*3/uL (ref 150–400)
RBC: 4.17 MIL/uL — ABNORMAL LOW (ref 4.22–5.81)
RDW: 13.1 % (ref 11.5–15.5)
WBC: 8.7 10*3/uL (ref 4.0–10.5)
nRBC: 0 % (ref 0.0–0.2)

## 2019-02-23 LAB — MAGNESIUM: Magnesium: 2.1 mg/dL (ref 1.7–2.4)

## 2019-02-23 LAB — C-REACTIVE PROTEIN: CRP: 10.1 mg/dL — ABNORMAL HIGH (ref <1.0)

## 2019-02-23 LAB — D-DIMER, QUANTITATIVE: D-Dimer, Quant: 0.49 ug/mL-FEU (ref 0.00–0.50)

## 2019-02-23 LAB — FERRITIN: Ferritin: 1239 ng/mL — ABNORMAL HIGH (ref 24–336)

## 2019-02-23 NOTE — Progress Notes (Signed)
PROGRESS NOTE    Brandon Farrell  XBM:[id number] DOB: 10/19/1960 DOA: 02/21/2019 PCP: Lucille Passy, MD   Brief Narrative:  Brandon Farrell is a 58 y.o. Hispanic male (Spanish speaker) PMHx HTN, HLD,, postherpetic neuralgia,  Presenting to the hospital with about 10 days of not feeling well, fever and shortness of breath and recently exacerbating symptoms in the context of multiple family members sick with COVID-19.  Patient is Spanish-speaking.  Interpreter was used.  Patient's wife and daughter were tested positive for COVID-19 about 10 days ago, patient also had symptoms of fever and abdominal pain, he was tested and that turned out to be negative, however he had symptoms so he was asked to quadrant time at home.  He was staying home.  His fever has slightly come down, abdominal pain improved, however he continues to have fatigue, generalized myalgia, cough with no sputum production, shortness of breath with exertion.  Has nausea but no vomiting.  He had several episodes of nonbloody watery diarrhea for last few days.  No urinary symptoms.  Patient has been taking ibuprofen and Tylenol at home with some relief.  Difficulty breathing with exertion and extreme fatigue was very concerning so he came to the emergency room after calling his primary care physician. ED Course: Patient is afebrile, taken ibuprofen before coming to the hospital. 91% on room air and started on 2 liters. WBC is 12.8. creatinine 1.5.  cxr shows LLL infiltrates .    Subjective: Acute issues or events overnight, continues to admit to shortness of breath even with minimal exertion.  Declines chest pain, nausea, vomiting, diarrhea, constipation, headache, fevers, chills.   Assessment & Plan:   Principal Problem:   Pneumonia due to COVID-19 virus Active Problems:   HYPERLIPIDEMIA   GERD   AKI (acute kidney injury) ([place])   Acute respiratory failure with hypoxia/pneumonia due to COVID 19  -Currently 2 L O2 via [place], SPO2 92%  -Patient with new onset O2 demand plus multifocal infiltrates on PCXR meets criteria Remdesivir per pharmacy protocol - Patient CVP> 7 meets criteria for Actemra. Patient with no history of hepatitis, tuberculosis, or use of immunosuppressant medication. - Bronchodilators continue - Flutter valve - Mucolytic's - Titrate O2 to maintain SPO2> 93%  - Solu-Medrol 40 mg TID COVID-19 Labs  Recent Labs    02/21/19 1442 02/21/19 1451 02/22/19 0230  DDIMER  --  0.71* 0.63*  FERRITIN 1,527* 1,473* 1,558*  LDH  --  227*  --   CRP 30.9* 27.0* 20.5*   AKI,  resolved -Normal saline 67ml/hr -KVO once patient's p.o. intake improves adequately - Hold all nephrotoxic medication  HLD -Lipitor 20 mg daily  DVT prophylaxis: Lovenox Code Status: Full Family Communication: None Disposition Plan: Pending clinical improvement; patient continues to require IV fluids, supplemental oxygen well above baseline, remains hypoxic even at rest -continues to require IV steroids, IV antivirals for an additional 96 hours at minimum.  Consultants:  None  Procedures/Significant Events:  7/27 PCXR: Multifocal infiltrates  Antimicrobials: 7/27 blood RIGHT hand NGTD 7/27 blood RIGHT antecubital NGTD 7/27 SARS coronavirus positive 7/27 HIV negative 7/28 acute hepatitis panel pending  Continuous Infusions: . sodium chloride Stopped (02/22/19 1438)  . sodium chloride 75 mL/hr at 02/23/19 0041  . remdesivir 100 mg in NS 250 mL       Objective: Vitals:   02/22/19 1416 02/22/19 1655 02/22/19 1955 02/23/19 0355  BP:  136/81 135/82 (!) 110/59  Pulse:  (!) 59 69 73  Resp:  20 20  Temp:  98.4 F (36.9 C) 98 F (36.7 C) 98.2 F (36.8 C)  TempSrc:  Oral Oral Oral  SpO2:  94% 95% 93%  Weight: 88 kg     Height:        Intake/Output Summary (Last 24 hours) at 02/23/2019 0801 Last data filed at 02/22/2019 2000 Gross per 24 hour  Intake 1990.46 ml  Output 825 ml  Net 1165.46 ml   Filed Weights    02/21/19 2300 02/22/19 1416  Weight: 88 kg 88 kg    Examination:  General: A/O x4, positive acute respiratory distress Currently 2 L O2 via [place], SPO2 92% Eyes: negative scleral hemorrhage, negative anisocoria, negative icterus ENT: Negative Runny nose, negative gingival bleeding, Neck:  Negative scars, masses, torticollis, lymphadenopathy, JVD Lungs: Clear to auscultation bilaterally without wheezes or crackles Cardiovascular: Regular rate and rhythm without murmur gallop or rub normal S1 and S2 Abdomen: negative abdominal pain, nondistended, positive soft, bowel sounds, no rebound, no ascites, no appreciable mass Extremities: No significant cyanosis, clubbing, or edema bilateral lower extremities Skin: Negative rashes, lesions, ulcers  CBC: Recent Labs  Lab 02/21/19 1135 02/22/19 0230 02/23/19 0230  WBC 12.8* 8.8 8.7  NEUTROABS 10.9* 7.7 PENDING  HGB 15.2 13.2 12.3*  HCT 45.3 40.0 37.6*  MCV 89.2 89.9 90.2  PLT 248 263 841   Basic Metabolic Panel: Recent Labs  Lab 02/21/19 1135 02/22/19 0230  NA 137 137  K 3.9 4.3  CL 101 105  CO2 24 22  GLUCOSE 108* 111*  BUN 23* 25*  CREATININE 1.50* 1.11  CALCIUM 9.2 8.7*  MG  --  2.1  PHOS  --  1.6*   GFR: Estimated Creatinine Clearance: 68.9 mL/min (by C-G formula based on SCr of 1.11 mg/dL). Liver Function Tests: Recent Labs  Lab 02/21/19 1135 02/22/19 0230  AST 41 45*  ALT 41 40  ALKPHOS 97 86  BILITOT 1.3* 0.7  PROT 8.3* 6.8  ALBUMIN 3.9 3.1*  Urine analysis:    Component Value Date/Time   COLORURINE Yellow 11/24/2011 2203   COLORURINE yellow 08/09/2009 0858   APPEARANCEUR Clear 11/24/2011 2203   LABSPEC 1.019 11/24/2011 2203   PHURINE 6.0 11/24/2011 2203   PHURINE 6.5 08/09/2009 0858   GLUCOSEU Negative 11/24/2011 2203   HGBUR Negative 11/24/2011 2203   HGBUR moderate 08/09/2009 0858   BILIRUBINUR negative 10/09/2016 1302   BILIRUBINUR Negative 11/24/2011 2203   KETONESUR Negative 11/24/2011 2203    PROTEINUR negative 10/09/2016 1302   PROTEINUR Negative 11/24/2011 2203   UROBILINOGEN 0.2 10/09/2016 1302   UROBILINOGEN 0.2 08/09/2009 0858   NITRITE negative 10/09/2016 1302   NITRITE Negative 11/24/2011 2203   NITRITE negative 08/09/2009 0858   LEUKOCYTESUR Negative 11/24/2011 2203    Recent Results (from the past 240 hour(s))  SARS Coronavirus 2 (CEPHEID- Performed in [facility] hospital lab), Hosp Order     Status: Abnormal   Collection Time: 02/21/19  2:07 PM   Specimen: Nasopharyngeal Swab  Result Value Ref Range Status   SARS Coronavirus 2 POSITIVE (A) NEGATIVE Final    Comment: RESULT CALLED TO, READ BACK BY AND VERIFIED WITH: Tsosie Billing [id number] @ [place] (NOTE) If result is NEGATIVE SARS-CoV-2 target nucleic acids are NOT DETECTED. The SARS-CoV-2 RNA is generally detectable in upper and lower  respiratory specimens during the acute phase of infection. The lowest  concentration of SARS-CoV-2 viral copies this assay can detect is 250  copies / mL. A negative result  does not preclude SARS-CoV-2 infection  and should not be used as the sole basis for treatment or other  patient management decisions.  A negative result may occur with  improper specimen collection / handling, submission of specimen other  than nasopharyngeal swab, presence of viral mutation(s) within the  areas targeted by this assay, and inadequate number of viral copies  (<250 copies / mL). A negative result must be combined with clinical  observations, patient history, and epidemiological information. If result is POSITIVE SARS-CoV-2 target nucleic acids are DET ECTED. The SARS-CoV-2 RNA is generally detectable in upper and lower  respiratory specimens during the acute phase of infection.  Positive  results are indicative of active infection with SARS-CoV-2.  Clinical  correlation with patient history and other diagnostic information is  necessary to determine patient infection status.   Positive results do  not rule out bacterial infection or co-infection with other viruses. If result is PRESUMPTIVE POSTIVE SARS-CoV-2 nucleic acids MAY BE PRESENT.   A presumptive positive result was obtained on the submitted specimen  and confirmed on repeat testing.  While 2019 novel coronavirus  (SARS-CoV-2) nucleic acids may be present in the submitted sample  additional confirmatory testing may be necessary for epidemiological  and / or clinical management purposes  to differentiate between  SARS-CoV-2 and other Sarbecovirus currently known to infect humans.  If clinically indicated additional testing with an alternate test  methodology ([place]) is advised. The SARS-CoV-2 RNA is generally  detectable in upper and lower respiratory specimens during the acute  phase of infection. The expected result is Negative. Fact Sheet for Patients:  StrictlyIdeas.no Fact Sheet for Healthcare Providers: BankingDealers.co.za This test is not yet approved or cleared by the [place] FDA and has been authorized for detection and/or diagnosis of SARS-CoV-2 by FDA under an Emergency Use Authorization (EUA).  This EUA will remain in effect (meaning this test can be used) for the duration of the COVID-19 declaration under Section 564(b)(1) of the Act, 21 U.S.C. section 360bbb-3(b)(1), unless the authorization is terminated or revoked sooner. Performed at [facility], [place] [address] [place], [place] [address]   Culture, blood (Routine X 2) w Reflex to ID Panel     Status: None (Preliminary result)   Collection Time: 02/21/19  2:43 PM   Specimen: BLOOD RIGHT HAND  Result Value Ref Range Status   Specimen Description   Final    BLOOD RIGHT HAND Performed at [place] [facility] [address], [place], [place] [address]    Special Requests   Final    BOTTLES DRAWN AEROBIC AND ANAEROBIC Blood Culture adequate volume  Performed at [facility] [address], [place], [place] [address]    Culture   Final    NO GROWTH 2 DAYS Performed at [place] [address], [place], [place] [address]    Report Status PENDING  Incomplete  Culture, blood (Routine X 2) w Reflex to ID Panel     Status: None (Preliminary result)   Collection Time: 02/21/19  2:48 PM   Specimen: BLOOD  Result Value Ref Range Status   Specimen Description   Final    BLOOD RIGHT ANTECUBITAL Performed at [facility], [place] [address], [place], [place] [address]    Special Requests   Final    BOTTLES DRAWN AEROBIC AND ANAEROBIC Blood Culture adequate volume Performed at [facility] [address], [place], [place] [address]  Culture   Final    NO GROWTH 2 DAYS Performed at [facility], [place] [address] [place], [place] [address]    Report Status PENDING  Incomplete    Radiology Studies: Dg Chest Portable 1 View  Result Date: 02/21/2019 CLINICAL DATA:  Worsening fever, weakness, covid symptoms x 2 weeks, states 2 family members tested positive EXAM: PORTABLE CHEST 1 VIEW COMPARISON:  None. FINDINGS: Heart size is accentuated by technique and normal. There is faint patchy opacity at the LEFT lung base and the RIGHT lung apex no pulmonary edema. IMPRESSION: Multifocal infiltrates. Electronically Signed   By: [name] Nations M.D.   On: 02/21/2019 12:10   Scheduled Meds: . albuterol  2 puff Inhalation Q6H  . atorvastatin  20 mg Oral Daily  . cholecalciferol  1,000 Units Oral Daily  . enoxaparin (LOVENOX) injection  40 mg Subcutaneous QHS  . ipratropium  2 puff Inhalation Q6H  . mouth rinse  15 mL Mouth Rinse BID  . methylPREDNISolone (SOLU-MEDROL) injection  40 mg Intravenous TID   Continuous Infusions: . sodium chloride Stopped (02/22/19 1438)  . sodium chloride 75 mL/hr at 02/23/19 0041  . remdesivir 100 mg in NS 250 mL       LOS: 2 days   Little Ishikawa,  MD [facility] Pager [phone]  If 7PM-7AM, please contact night-coverage [email or web address] Password [email or web address] 02/23/2019, 8:01 AM

## 2019-02-24 DIAGNOSIS — K219 Gastro-esophageal reflux disease without esophagitis: Secondary | ICD-10-CM

## 2019-02-24 LAB — CBC WITH DIFFERENTIAL/PLATELET
Abs Immature Granulocytes: 0.15 10*3/uL — ABNORMAL HIGH (ref 0.00–0.07)
Basophils Absolute: 0 10*3/uL (ref 0.0–0.1)
Basophils Relative: 0 %
Eosinophils Absolute: 0 10*3/uL (ref 0.0–0.5)
Eosinophils Relative: 0 %
HCT: 36.7 % — ABNORMAL LOW (ref 39.0–52.0)
Hemoglobin: 12 g/dL — ABNORMAL LOW (ref 13.0–17.0)
Immature Granulocytes: 1 %
Lymphocytes Relative: 10 %
Lymphs Abs: 1.1 10*3/uL (ref 0.7–4.0)
MCH: 29.6 pg (ref 26.0–34.0)
MCHC: 32.7 g/dL (ref 30.0–36.0)
MCV: 90.4 fL (ref 80.0–100.0)
Monocytes Absolute: 0.5 10*3/uL (ref 0.1–1.0)
Monocytes Relative: 5 %
Neutro Abs: 9.4 10*3/uL — ABNORMAL HIGH (ref 1.7–7.7)
Neutrophils Relative %: 84 %
Platelets: 297 10*3/uL (ref 150–400)
RBC: 4.06 MIL/uL — ABNORMAL LOW (ref 4.22–5.81)
RDW: 13.3 % (ref 11.5–15.5)
WBC: 11.1 10*3/uL — ABNORMAL HIGH (ref 4.0–10.5)
nRBC: 0 % (ref 0.0–0.2)

## 2019-02-24 LAB — COMPREHENSIVE METABOLIC PANEL
ALT: 82 U/L — ABNORMAL HIGH (ref 0–44)
AST: 51 U/L — ABNORMAL HIGH (ref 15–41)
Albumin: 2.8 g/dL — ABNORMAL LOW (ref 3.5–5.0)
Alkaline Phosphatase: 68 U/L (ref 38–126)
Anion gap: 10 (ref 5–15)
BUN: 23 mg/dL — ABNORMAL HIGH (ref 6–20)
CO2: 22 mmol/L (ref 22–32)
Calcium: 8.9 mg/dL (ref 8.9–10.3)
Chloride: 110 mmol/L (ref 98–111)
Creatinine, Ser: 0.93 mg/dL (ref 0.61–1.24)
GFR calc Af Amer: >60 mL/min (ref >60)
GFR calc non Af Amer: >60 mL/min (ref >60)
Glucose, Bld: 136 mg/dL — ABNORMAL HIGH (ref 70–99)
Potassium: 4.5 mmol/L (ref 3.5–5.1)
Sodium: 142 mmol/L (ref 135–145)
Total Bilirubin: 0.5 mg/dL (ref 0.3–1.2)
Total Protein: 6 g/dL — ABNORMAL LOW (ref 6.5–8.1)

## 2019-02-24 LAB — C-REACTIVE PROTEIN: CRP: 4.6 mg/dL — ABNORMAL HIGH (ref <1.0)

## 2019-02-24 LAB — D-DIMER, QUANTITATIVE: D-Dimer, Quant: 0.36 ug/mL-FEU (ref 0.00–0.50)

## 2019-02-24 LAB — PHOSPHORUS: Phosphorus: 3.6 mg/dL (ref 2.5–4.6)

## 2019-02-24 LAB — FERRITIN: Ferritin: 1071 ng/mL — ABNORMAL HIGH (ref 24–336)

## 2019-02-24 LAB — MAGNESIUM: Magnesium: 2.1 mg/dL (ref 1.7–2.4)

## 2019-02-24 LAB — CK: Total CK: 52 U/L (ref 49–397)

## 2019-02-24 MED ORDER — DEXAMETHASONE SODIUM PHOSPHATE 10 MG/ML IJ SOLN
6.0000 mg | Freq: Every day | INTRAMUSCULAR | Status: DC
  Administered 2019-02-24 – 2019-02-26 (×3): 6 mg via INTRAVENOUS
  Filled 2019-02-24 (×3): qty 1

## 2019-02-24 MED ORDER — ALBUTEROL SULFATE HFA 108 (90 BASE) MCG/ACT IN AERS
2.0000 | INHALATION_SPRAY | Freq: Four times a day (QID) | RESPIRATORY_TRACT | Status: DC | PRN
  Administered 2019-02-24 – 2019-02-26 (×5): 2 via RESPIRATORY_TRACT

## 2019-02-24 NOTE — Progress Notes (Signed)
PROGRESS NOTE    [name]  IFO:[id number] DOB: [birth year] DOA: 02/21/2019 PCP: Lucille Passy, MD      Brief Narrative:  Mr. Morozov is a 58 y.o. M with HTN untreated who presented with 10 days malaise, and COVID+ contacts.  Finally developed dyspnea on exertion, so came to ER.  Afebrile, hypoxic requiring 2L O2.  CXR with LLL infiltrate.    Assessment & Plan:  Coronavirus pneumonitis with acute hypoxic respiratory failure In setting of ongoing 2020 COVID-19 pandemic. Admitted 7/27, no fever here.  S/p Actemra 7/28  -Continue remdesivir day 3 of 5 -Continue steroids, day 4, taper dose  -VTE PPx with Lovenox  -Stop IV fluids  COVID-19 Labs Recent Labs    02/21/19 1451 02/22/19 0230 02/23/19 0230 02/24/19 0415  DDIMER 0.71* 0.63* 0.49 0.36  FERRITIN 1,473* 1,558* 1,239* 1,071*  LDH 227*  --   --   --   CRP 27.0* 20.5* 10.1* 4.6*      Hypertension Hyperlipidemia BP high normal off meds -Continue atorvastatin  AKI Creatinine 1.5 on admission, now resolved to baseline 0.9        MDM and disposition: The below labs and imaging reports were reviewed and summarized above.  Medication management as above.  The patient was admitted with COVID-19.  This is a severe infection, he is improving, now on only 2L O2.  Anticipate continued IV remdesivir, wean O2 as able.  Optimistically home by Saturday if remdesivir course complete and stable.         DVT prophylaxis: Lovenox Code Status: FULL Family Communication: Daughter by phone    Consultants:   None  Procedures:   None  Antimicrobials:   Remdesivir        Subjective: All history collected through video phonic interpreter.  Feeling some improvement.  No dyspnea, no chest pain.  No headache.  No confusion.  Appetite okay.  Objective: Vitals:   02/23/19 1608 02/23/19 2047 02/24/19 0425 02/24/19 0733  BP: 129/73 (!) 143/73 119/76 (!) 141/81  Pulse: 60 63 62 (!) 53  Resp: 16 18   16   Temp: 98.2 F (36.8 C) 98.1 F (36.7 C) 97.6 F (36.4 C) 97.9 F (36.6 C)  TempSrc: Oral Oral Oral Oral  SpO2: 96% 94%  97%  Weight:   87.5 kg   Height:        Intake/Output Summary (Last 24 hours) at 02/24/2019 1448 Last data filed at 02/24/2019 1200 Gross per 24 hour  Intake 3053.5 ml  Output 1950 ml  Net 1103.5 ml   Filed Weights   02/21/19 2300 02/22/19 1416 02/24/19 0425  Weight: 88 kg 88 kg 87.5 kg    Examination: General appearance:  adult male, alert and in no acute distress.   HEENT: Anicteric, conjunctiva pink, lids and lashes normal. No nasal deformity, discharge, epistaxis.  Lips moist, dentition good, oropharynx moist, no oral lesions, hearing normal.   Skin: Warm and dry.  No jaundice.  No suspicious rashes or lesions. Cardiac: RRR, nl S1-S2, no murmurs appreciated.  Capillary refill is brisk.  JVP normal.  No LE edema.  Radial pulses 2+ and symmetric. Respiratory: Normal respiratory rate and rhythm.  CTAB without rales or wheezes. Abdomen: Abdomen soft.  No TTP or guarding. No ascites, distension, hepatosplenomegaly.   MSK: No deformities or effusions. Neuro: Awake and alert.  EOMI, moves all extremities. Speech fluent.    Psych: Sensorium intact and responding to questions, attention normal. Affect normal.  Judgment and  insight appear normal.       Data Reviewed: I have personally reviewed following labs and imaging studies:  CBC: Recent Labs  Lab 02/21/19 1135 02/22/19 0230 02/23/19 0230 02/24/19 0415  WBC 12.8* 8.8 8.7 11.1*  NEUTROABS 10.9* 7.7 7.2 9.4*  HGB 15.2 13.2 12.3* 12.0*  HCT 45.3 40.0 37.6* 36.7*  MCV 89.2 89.9 90.2 90.4  PLT 248 263 280 546   Basic Metabolic Panel: Recent Labs  Lab 02/21/19 1135 02/22/19 0230 02/23/19 0230 02/24/19 0415  NA 137 137 139 142  K 3.9 4.3 4.0 4.5  CL 101 105 108 110  CO2 24 22 21* 22  GLUCOSE 108* 111* 130* 136*  BUN 23* 25* 21* 23*  CREATININE 1.50* 1.11 0.86 0.93  CALCIUM 9.2 8.7* 8.7*  8.9  MG  --  2.1 2.1 2.1  PHOS  --  1.6* 2.2* 3.6   GFR: Estimated Creatinine Clearance: 82 mL/min (by C-G formula based on SCr of 0.93 mg/dL). Liver Function Tests: Recent Labs  Lab 02/21/19 1135 02/22/19 0230 02/23/19 0230 02/24/19 0415  AST 41 45* 64* 51*  ALT 41 40 67* 82*  ALKPHOS 97 86 74 68  BILITOT 1.3* 0.7 0.4 0.5  PROT 8.3* 6.8 6.3* 6.0*  ALBUMIN 3.9 3.1* 2.8* 2.8*   No results for input(s): LIPASE, AMYLASE in the last 168 hours. No results for input(s): AMMONIA in the last 168 hours. Coagulation Profile: No results for input(s): INR, PROTIME in the last 168 hours. Cardiac Enzymes: Recent Labs  Lab 02/22/19 0230 02/23/19 0230 02/24/19 0415  CKTOTAL 79 70 52   BNP (last 3 results) No results for input(s): PROBNP in the last 8760 hours. HbA1C: No results for input(s): HGBA1C in the last 72 hours. CBG: No results for input(s): GLUCAP in the last 168 hours. Lipid Profile: Recent Labs    02/23/19 0230  CHOL 104  HDL 12*  LDLCALC 67  TRIG 126  CHOLHDL 8.7   Thyroid Function Tests: No results for input(s): TSH, T4TOTAL, FREET4, T3FREE, THYROIDAB in the last 72 hours. Anemia Panel: Recent Labs    02/23/19 0230 02/24/19 0415  FERRITIN 1,239* 1,071*   Urine analysis:    Component Value Date/Time   COLORURINE Yellow 11/24/2011 2203   COLORURINE yellow 08/09/2009 0858   APPEARANCEUR Clear 11/24/2011 2203   LABSPEC 1.019 11/24/2011 2203   PHURINE 6.0 11/24/2011 2203   PHURINE 6.5 08/09/2009 0858   GLUCOSEU Negative 11/24/2011 2203   HGBUR Negative 11/24/2011 2203   HGBUR moderate 08/09/2009 0858   BILIRUBINUR negative 10/09/2016 1302   BILIRUBINUR Negative 11/24/2011 2203   KETONESUR Negative 11/24/2011 2203   PROTEINUR negative 10/09/2016 1302   PROTEINUR Negative 11/24/2011 2203   UROBILINOGEN 0.2 10/09/2016 1302   UROBILINOGEN 0.2 08/09/2009 0858   NITRITE negative 10/09/2016 1302   NITRITE Negative 11/24/2011 2203   NITRITE negative  08/09/2009 0858   LEUKOCYTESUR Negative 11/24/2011 2203   Sepsis Labs: @LABRCNTIP (procalcitonin:4,lacticacidven:4)  ) Recent Results (from the past 240 hour(s))  SARS Coronavirus 2 (CEPHEID- Performed in [place] hospital lab), Hosp Order     Status: Abnormal   Collection Time: 02/21/19  2:07 PM   Specimen: Nasopharyngeal Swab  Result Value Ref Range Status   SARS Coronavirus 2 POSITIVE (A) NEGATIVE Final    Comment: RESULT CALLED TO, READ BACK BY AND VERIFIED WITH: Tsosie Billing [id number] @ [place] (NOTE) If result is NEGATIVE SARS-CoV-2 target nucleic acids are NOT DETECTED. The SARS-CoV-2 RNA is generally detectable  in upper and lower  respiratory specimens during the acute phase of infection. The lowest  concentration of SARS-CoV-2 viral copies this assay can detect is 250  copies / mL. A negative result does not preclude SARS-CoV-2 infection  and should not be used as the sole basis for treatment or other  patient management decisions.  A negative result may occur with  improper specimen collection / handling, submission of specimen other  than nasopharyngeal swab, presence of viral mutation(s) within the  areas targeted by this assay, and inadequate number of viral copies  (<250 copies / mL). A negative result must be combined with clinical  observations, patient history, and epidemiological information. If result is POSITIVE SARS-CoV-2 target nucleic acids are DET ECTED. The SARS-CoV-2 RNA is generally detectable in upper and lower  respiratory specimens during the acute phase of infection.  Positive  results are indicative of active infection with SARS-CoV-2.  Clinical  correlation with patient history and other diagnostic information is  necessary to determine patient infection status.  Positive results do  not rule out bacterial infection or co-infection with other viruses. If result is PRESUMPTIVE POSTIVE SARS-CoV-2 nucleic acids MAY BE PRESENT.   A  presumptive positive result was obtained on the submitted specimen  and confirmed on repeat testing.  While 2019 novel coronavirus  (SARS-CoV-2) nucleic acids may be present in the submitted sample  additional confirmatory testing may be necessary for epidemiological  and / or clinical management purposes  to differentiate between  SARS-CoV-2 and other Sarbecovirus currently known to infect humans.  If clinically indicated additional testing with an alternate test  methodology ([place]) is advised. The SARS-CoV-2 RNA is generally  detectable in upper and lower respiratory specimens during the acute  phase of infection. The expected result is Negative. Fact Sheet for Patients:  StrictlyIdeas.no Fact Sheet for Healthcare Providers: BankingDealers.co.za This test is not yet approved or cleared by the [place] FDA and has been authorized for detection and/or diagnosis of SARS-CoV-2 by FDA under an Emergency Use Authorization (EUA).  This EUA will remain in effect (meaning this test can be used) for the duration of the COVID-19 declaration under Section 564(b)(1) of the Act, 21 U.S.C. section 360bbb-3(b)(1), unless the authorization is terminated or revoked sooner. Performed at [facility], [place] [address], [place], [place] [address]   Culture, blood (Routine X 2) w Reflex to ID Panel     Status: None (Preliminary result)   Collection Time: 02/21/19  2:43 PM   Specimen: BLOOD RIGHT HAND  Result Value Ref Range Status   Specimen Description   Final    BLOOD RIGHT HAND Performed at [facility] [address], [place], [place] [address]    Special Requests   Final    BOTTLES DRAWN AEROBIC AND ANAEROBIC Blood Culture adequate volume Performed at [facility] [address] [place], [place] [address]    Culture   Final    NO GROWTH 3 DAYS Performed at [facility],  [place] [address] [place], [place] [address]    Report Status PENDING  Incomplete  Culture, blood (Routine X 2) w Reflex to ID Panel     Status: None (Preliminary result)   Collection Time: 02/21/19  2:48 PM   Specimen: BLOOD  Result Value Ref Range Status   Specimen Description   Final    BLOOD RIGHT ANTECUBITAL Performed at [facility], [place] [address], [place], [place] [address]  Special Requests   Final    BOTTLES DRAWN AEROBIC AND ANAEROBIC Blood Culture adequate volume Performed at [facility] [address], [place], [place] [address]    Culture   Final    NO GROWTH 3 DAYS Performed at [facility], [place] [address], [place], [place] [address]    Report Status PENDING  Incomplete         Radiology Studies: No results found.      Scheduled Meds: . atorvastatin  20 mg Oral Daily  . cholecalciferol  1,000 Units Oral Daily  . dexamethasone (DECADRON) injection  6 mg Intravenous Q breakfast  . enoxaparin (LOVENOX) injection  40 mg Subcutaneous QHS  . ipratropium  2 puff Inhalation Q6H  . mouth rinse  15 mL Mouth Rinse BID   Continuous Infusions: . remdesivir 100 mg in NS 250 mL Stopped (02/24/19 1136)     LOS: 3 days    Time spent: 25 mimutes      Edwin Dada, MD [facility] 02/24/2019, 2:48 PM     Please page through [place]:  [email or web address] Password [email or web address] If 7PM-7AM, please contact night-coverage

## 2019-02-25 LAB — CBC WITH DIFFERENTIAL/PLATELET
Abs Immature Granulocytes: 0.39 10*3/uL — ABNORMAL HIGH (ref 0.00–0.07)
Basophils Absolute: 0 10*3/uL (ref 0.0–0.1)
Basophils Relative: 0 %
Eosinophils Absolute: 0 10*3/uL (ref 0.0–0.5)
Eosinophils Relative: 0 %
HCT: 38.6 % — ABNORMAL LOW (ref 39.0–52.0)
Hemoglobin: 12.8 g/dL — ABNORMAL LOW (ref 13.0–17.0)
Immature Granulocytes: 4 %
Lymphocytes Relative: 13 %
Lymphs Abs: 1.3 10*3/uL (ref 0.7–4.0)
MCH: 29.5 pg (ref 26.0–34.0)
MCHC: 33.2 g/dL (ref 30.0–36.0)
MCV: 88.9 fL (ref 80.0–100.0)
Monocytes Absolute: 0.8 10*3/uL (ref 0.1–1.0)
Monocytes Relative: 7 %
Neutro Abs: 7.9 10*3/uL — ABNORMAL HIGH (ref 1.7–7.7)
Neutrophils Relative %: 76 %
Platelets: 322 10*3/uL (ref 150–400)
RBC: 4.34 MIL/uL (ref 4.22–5.81)
RDW: 13.1 % (ref 11.5–15.5)
WBC: 10.4 10*3/uL (ref 4.0–10.5)
nRBC: 0 % (ref 0.0–0.2)

## 2019-02-25 LAB — COMPREHENSIVE METABOLIC PANEL
ALT: 180 U/L — ABNORMAL HIGH (ref 0–44)
AST: 95 U/L — ABNORMAL HIGH (ref 15–41)
Albumin: 3 g/dL — ABNORMAL LOW (ref 3.5–5.0)
Alkaline Phosphatase: 66 U/L (ref 38–126)
Anion gap: 8 (ref 5–15)
BUN: 25 mg/dL — ABNORMAL HIGH (ref 6–20)
CO2: 25 mmol/L (ref 22–32)
Calcium: 9 mg/dL (ref 8.9–10.3)
Chloride: 106 mmol/L (ref 98–111)
Creatinine, Ser: 0.92 mg/dL (ref 0.61–1.24)
GFR calc Af Amer: >60 mL/min (ref >60)
GFR calc non Af Amer: >60 mL/min (ref >60)
Glucose, Bld: 119 mg/dL — ABNORMAL HIGH (ref 70–99)
Potassium: 4 mmol/L (ref 3.5–5.1)
Sodium: 139 mmol/L (ref 135–145)
Total Bilirubin: 0.6 mg/dL (ref 0.3–1.2)
Total Protein: 6.3 g/dL — ABNORMAL LOW (ref 6.5–8.1)

## 2019-02-25 LAB — MAGNESIUM: Magnesium: 2.1 mg/dL (ref 1.7–2.4)

## 2019-02-25 NOTE — Progress Notes (Signed)
PROGRESS NOTE    Brandon Farrell  OIZ:[id number] DOB: 08-08-1960 DOA: 02/21/2019 PCP: Lucille Passy, MD      Brief Narrative:  Brandon Farrell is a 58 y.o. M with HTN untreated who presented with 10 days malaise, and COVID+ contacts.  Finally developed dyspnea on exertion, so came to ER.  Afebrile, hypoxic requiring 2L O2.  CXR with LLL infiltrate.    Assessment & Plan:  Coronavirus pneumonitis with acute hypoxic respiratory failure In setting of ongoing 2020 COVID-19 pandemic. Admitted 7/27, no fever here.  S/p Actemra 7/28  -Continue remdesivir day 4 of 5 -Continue steroids, day 5   -Continue VTE PPx with Lovenox      Hypertension Hyperlipidemia BP high normal off meds -Continue atorvastatin  AKI Creatinine 1.5 on admission, now resolved to baseline 0.9        MDM and disposition: The below labs and imaging reports were reviewed and summarized above.  Medication management as above.  The patient was admitted with COVID-19.  He is now improving, has been weaned off of oxygen.  We will anticipate finishing his course of remdesivir tomorrow, if stable off of oxygen, likely home tomorrow        DVT prophylaxis: Lovenox Code Status: FULL Family Communication: Daughter by phone    Consultants:   None  Procedures:   None  Antimicrobials:   Remdesivir        Subjective: All history collected through video phonic interpreter.  Patient feels "fine".  No hemoptysis, dyspnea, chest pain, headache, fever, confusion.  Appetite normal.  No diarrhea vomiting.  Objective: Vitals:   02/25/19 1100 02/25/19 1200 02/25/19 1300 02/25/19 1400  BP:      Pulse: (!) 54 (!) 39 (!) 56 63  Resp:      Temp:      TempSrc:      SpO2: 92% 94% 93% 93%  Weight:      Height:        Intake/Output Summary (Last 24 hours) at 02/25/2019 1437 Last data filed at 02/25/2019 1300 Gross per 24 hour  Intake 730 ml  Output 900 ml  Net -170 ml   Filed Weights   02/22/19 1416 02/24/19 0425 02/25/19 0500  Weight: 88 kg 87.5 kg 88.2 kg    Examination: General appearance: Adult male, sitting up in bed, no acute distress, interactive HEENT: Anicteric, conjunctival pink, lids and lashes normal.  No nasal deformity, discharge, or epistaxis.  Lips moist, dentition normal, oropharynx moist, no oral lesions, hearing normal. Skin: Skin warm and dry, no suspicious rashes or lesions. Cardiac: Regular rate and rhythm, no murmurs, JVP normal, no lower extremity edema. Respiratory: Normal respiratory rate and rhythm, lungs clear without rales or wheezes. Abdomen: Abdomen soft without tenderness to palpation or guarding, no ascites, distention.   MSK: No deformities or effusions of the large joints of the upper lower extremities bilaterally, normal muscle bulk and tone. Neuro: Awake and alert, extraocular movements intact, moves all extremities with normal strength and coordination, speech fluent.    Psych: Sensorium intact responding to questions, attention normal, affect normal, judgment and insight appear normal.       Data Reviewed: I have personally reviewed following labs and imaging studies:  CBC: Recent Labs  Lab 02/21/19 1135 02/22/19 0230 02/23/19 0230 02/24/19 0415 02/25/19 0113  WBC 12.8* 8.8 8.7 11.1* 10.4  NEUTROABS 10.9* 7.7 7.2 9.4* 7.9*  HGB 15.2 13.2 12.3* 12.0* 12.8*  HCT 45.3 40.0 37.6* 36.7* 38.6*  MCV  89.2 89.9 90.2 90.4 88.9  PLT 248 263 280 297 884   Basic Metabolic Panel: Recent Labs  Lab 02/21/19 1135 02/22/19 0230 02/23/19 0230 02/24/19 0415 02/25/19 0113  NA 137 137 139 142 139  K 3.9 4.3 4.0 4.5 4.0  CL 101 105 108 110 106  CO2 24 22 21* 22 25  GLUCOSE 108* 111* 130* 136* 119*  BUN 23* 25* 21* 23* 25*  CREATININE 1.50* 1.11 0.86 0.93 0.92  CALCIUM 9.2 8.7* 8.7* 8.9 9.0  MG  --  2.1 2.1 2.1 2.1  PHOS  --  1.6* 2.2* 3.6  --    GFR: Estimated Creatinine Clearance: 83.2 mL/min (by C-G formula based on SCr of  0.92 mg/dL). Liver Function Tests: Recent Labs  Lab 02/21/19 1135 02/22/19 0230 02/23/19 0230 02/24/19 0415 02/25/19 0113  AST 41 45* 64* 51* 95*  ALT 41 40 67* 82* 180*  ALKPHOS 97 86 74 68 66  BILITOT 1.3* 0.7 0.4 0.5 0.6  PROT 8.3* 6.8 6.3* 6.0* 6.3*  ALBUMIN 3.9 3.1* 2.8* 2.8* 3.0*   No results for input(s): LIPASE, AMYLASE in the last 168 hours. No results for input(s): AMMONIA in the last 168 hours. Coagulation Profile: No results for input(s): INR, PROTIME in the last 168 hours. Cardiac Enzymes: Recent Labs  Lab 02/22/19 0230 02/23/19 0230 02/24/19 0415  CKTOTAL 79 70 52   BNP (last 3 results) No results for input(s): PROBNP in the last 8760 hours. HbA1C: No results for input(s): HGBA1C in the last 72 hours. CBG: No results for input(s): GLUCAP in the last 168 hours. Lipid Profile: Recent Labs    02/23/19 0230  CHOL 104  HDL 12*  LDLCALC 67  TRIG 126  CHOLHDL 8.7   Thyroid Function Tests: No results for input(s): TSH, T4TOTAL, FREET4, T3FREE, THYROIDAB in the last 72 hours. Anemia Panel: Recent Labs    02/23/19 0230 02/24/19 0415  FERRITIN 1,239* 1,071*   Urine analysis:    Component Value Date/Time   COLORURINE Yellow 11/24/2011 2203   COLORURINE yellow 08/09/2009 0858   APPEARANCEUR Clear 11/24/2011 2203   LABSPEC 1.019 11/24/2011 2203   PHURINE 6.0 11/24/2011 2203   PHURINE 6.5 08/09/2009 0858   GLUCOSEU Negative 11/24/2011 2203   HGBUR Negative 11/24/2011 2203   HGBUR moderate 08/09/2009 0858   BILIRUBINUR negative 10/09/2016 1302   BILIRUBINUR Negative 11/24/2011 2203   KETONESUR Negative 11/24/2011 2203   PROTEINUR negative 10/09/2016 1302   PROTEINUR Negative 11/24/2011 2203   UROBILINOGEN 0.2 10/09/2016 1302   UROBILINOGEN 0.2 08/09/2009 0858   NITRITE negative 10/09/2016 1302   NITRITE Negative 11/24/2011 2203   NITRITE negative 08/09/2009 0858   LEUKOCYTESUR Negative 11/24/2011 2203   Sepsis Labs:  @[email or web address] (procalcitonin:4,lacticacidven:4)  ) Recent Results (from the past 240 hour(s))  SARS Coronavirus 2 (CEPHEID- Performed in [facility] hospital lab), Hosp Order     Status: Abnormal   Collection Time: 02/21/19  2:07 PM   Specimen: Nasopharyngeal Swab  Result Value Ref Range Status   SARS Coronavirus 2 POSITIVE (A) NEGATIVE Final    Comment: RESULT CALLED TO, READ BACK BY AND VERIFIED WITH: Tsosie Billing [id number] @ [place] (NOTE) If result is NEGATIVE SARS-CoV-2 target nucleic acids are NOT DETECTED. The SARS-CoV-2 RNA is generally detectable in upper and lower  respiratory specimens during the acute phase of infection. The lowest  concentration of SARS-CoV-2 viral copies this assay can detect is 250  copies / mL. A negative result does not  preclude SARS-CoV-2 infection  and should not be used as the sole basis for treatment or other  patient management decisions.  A negative result may occur with  improper specimen collection / handling, submission of specimen other  than nasopharyngeal swab, presence of viral mutation(s) within the  areas targeted by this assay, and inadequate number of viral copies  (<250 copies / mL). A negative result must be combined with clinical  observations, patient history, and epidemiological information. If result is POSITIVE SARS-CoV-2 target nucleic acids are DET ECTED. The SARS-CoV-2 RNA is generally detectable in upper and lower  respiratory specimens during the acute phase of infection.  Positive  results are indicative of active infection with SARS-CoV-2.  Clinical  correlation with patient history and other diagnostic information is  necessary to determine patient infection status.  Positive results do  not rule out bacterial infection or co-infection with other viruses. If result is PRESUMPTIVE POSTIVE SARS-CoV-2 nucleic acids MAY BE PRESENT.   A presumptive positive result was obtained on the submitted specimen  and  confirmed on repeat testing.  While 2019 novel coronavirus  (SARS-CoV-2) nucleic acids may be present in the submitted sample  additional confirmatory testing may be necessary for epidemiological  and / or clinical management purposes  to differentiate between  SARS-CoV-2 and other Sarbecovirus currently known to infect humans.  If clinically indicated additional testing with an alternate test  methodology ([place]) is advised. The SARS-CoV-2 RNA is generally  detectable in upper and lower respiratory specimens during the acute  phase of infection. The expected result is Negative. Fact Sheet for Patients:  StrictlyIdeas.no Fact Sheet for Healthcare Providers: BankingDealers.co.za This test is not yet approved or cleared by the [place] FDA and has been authorized for detection and/or diagnosis of SARS-CoV-2 by FDA under an Emergency Use Authorization (EUA).  This EUA will remain in effect (meaning this test can be used) for the duration of the COVID-19 declaration under Section 564(b)(1) of the Act, 21 U.S.C. section 360bbb-3(b)(1), unless the authorization is terminated or revoked sooner. Performed at [facility], [place] [address] [place], [place] [address]   Culture, blood (Routine X 2) w Reflex to ID Panel     Status: None (Preliminary result)   Collection Time: 02/21/19  2:43 PM   Specimen: BLOOD RIGHT HAND  Result Value Ref Range Status   Specimen Description   Final    BLOOD RIGHT HAND Performed at [facility] [address] [place], [place] [address]    Special Requests   Final    BOTTLES DRAWN AEROBIC AND ANAEROBIC Blood Culture adequate volume Performed at [facility] [address], [place], [place] [address]    Culture   Final    NO GROWTH 4 DAYS Performed at [facility], [place] [address], [place], [place] [address]    Report Status PENDING   Incomplete  Culture, blood (Routine X 2) w Reflex to ID Panel     Status: None (Preliminary result)   Collection Time: 02/21/19  2:48 PM   Specimen: BLOOD  Result Value Ref Range Status   Specimen Description   Final    BLOOD RIGHT ANTECUBITAL Performed at [facility], [place] [address] [place], [place] [address]    Special Requests   Final    BOTTLES DRAWN AEROBIC AND ANAEROBIC Blood Culture adequate volume Performed at [facility] [place] [address] [place],  [address]  Culture   Final    NO GROWTH 4 DAYS Performed at [facility], [place] [address], [place], [place] [address]    Report Status PENDING  Incomplete         Radiology Studies: No results found.      Scheduled Meds: . atorvastatin  20 mg Oral Daily  . cholecalciferol  1,000 Units Oral Daily  . dexamethasone (DECADRON) injection  6 mg Intravenous Q breakfast  . enoxaparin (LOVENOX) injection  40 mg Subcutaneous QHS  . ipratropium  2 puff Inhalation Q6H  . mouth rinse  15 mL Mouth Rinse BID   Continuous Infusions: . remdesivir 100 mg in NS 250 mL 100 mg (02/25/19 1108)     LOS: 4 days    Time spent: 25 minutes      Edwin Dada, MD [facility] 02/25/2019, 2:37 PM     Please page through [place]:  [email or web address] Password [email or web address] If 7PM-7AM, please contact night-coverage

## 2019-02-26 DIAGNOSIS — R740 Nonspecific elevation of levels of transaminase and lactic acid dehydrogenase [LDH]: Secondary | ICD-10-CM

## 2019-02-26 LAB — COMPREHENSIVE METABOLIC PANEL
ALT: 181 U/L — ABNORMAL HIGH (ref 0–44)
AST: 64 U/L — ABNORMAL HIGH (ref 15–41)
Albumin: 3.2 g/dL — ABNORMAL LOW (ref 3.5–5.0)
Alkaline Phosphatase: 66 U/L (ref 38–126)
Anion gap: 11 (ref 5–15)
BUN: 28 mg/dL — ABNORMAL HIGH (ref 6–20)
CO2: 25 mmol/L (ref 22–32)
Calcium: 8.8 mg/dL — ABNORMAL LOW (ref 8.9–10.3)
Chloride: 101 mmol/L (ref 98–111)
Creatinine, Ser: 1.02 mg/dL (ref 0.61–1.24)
GFR calc Af Amer: >60 mL/min (ref >60)
GFR calc non Af Amer: >60 mL/min (ref >60)
Glucose, Bld: 119 mg/dL — ABNORMAL HIGH (ref 70–99)
Potassium: 4.2 mmol/L (ref 3.5–5.1)
Sodium: 137 mmol/L (ref 135–145)
Total Bilirubin: 0.7 mg/dL (ref 0.3–1.2)
Total Protein: 6.4 g/dL — ABNORMAL LOW (ref 6.5–8.1)

## 2019-02-26 LAB — CBC WITH DIFFERENTIAL/PLATELET
Abs Immature Granulocytes: 0.51 10*3/uL — ABNORMAL HIGH (ref 0.00–0.07)
Basophils Absolute: 0.1 10*3/uL (ref 0.0–0.1)
Basophils Relative: 1 %
Eosinophils Absolute: 0 10*3/uL (ref 0.0–0.5)
Eosinophils Relative: 0 %
HCT: 39.4 % (ref 39.0–52.0)
Hemoglobin: 12.9 g/dL — ABNORMAL LOW (ref 13.0–17.0)
Immature Granulocytes: 6 %
Lymphocytes Relative: 18 %
Lymphs Abs: 1.7 10*3/uL (ref 0.7–4.0)
MCH: 29.3 pg (ref 26.0–34.0)
MCHC: 32.7 g/dL (ref 30.0–36.0)
MCV: 89.3 fL (ref 80.0–100.0)
Monocytes Absolute: 0.8 10*3/uL (ref 0.1–1.0)
Monocytes Relative: 8 %
Neutro Abs: 6.3 10*3/uL (ref 1.7–7.7)
Neutrophils Relative %: 67 %
Platelets: 324 10*3/uL (ref 150–400)
RBC: 4.41 MIL/uL (ref 4.22–5.81)
RDW: 12.8 % (ref 11.5–15.5)
WBC: 9.2 10*3/uL (ref 4.0–10.5)
nRBC: 0 % (ref 0.0–0.2)

## 2019-02-26 LAB — CULTURE, BLOOD (ROUTINE X 2)
Culture: NO GROWTH
Culture: NO GROWTH
Special Requests: ADEQUATE
Special Requests: ADEQUATE

## 2019-02-26 LAB — MAGNESIUM: Magnesium: 2.3 mg/dL (ref 1.7–2.4)

## 2019-02-26 NOTE — Discharge Summary (Signed)
Physician Discharge Summary  Brandon Farrell PPI:[id number] DOB: 01-Dec-1960 DOA: 02/21/2019  PCP: Lucille Passy, MD  Admit date: 02/21/2019 Discharge date: 02/26/2019  Admitted From: Home  Disposition:  Home   Recommendations for Outpatient Follow-up:  1. Follow up with PCP in 1-2 weeks 2. Please obtain LFTs in 2-3 months to verify return to normal     Home Health: None  Equipment/Devices: None  Discharge Condition: Good  CODE STATUS: FULL Diet recommendation: Regular  Brief/Interim Summary: Brandon Farrell is a 58 y.o. M with HTN untreated who presented with 10 days malaise, and COVID+ contacts.  Finally developed dyspnea on exertion, so came to ER.  Afebrile, hypoxic requiring 2L O2.  CXR with LLL infiltrate.     PRINCIPAL HOSPITAL DIAGNOSIS: COVID-19    Discharge Diagnoses:   Coronavirus pneumonitis with acute hypoxic respiratory failure In setting of ongoing 2020 COVID-19 pandemic.  Admitted 7/27, no fever here.  Treated with steroids, 5 days remdesivir, Actemra and prophylactic dose Lovenox.  Able to wean completely off O2.  Symptoms resolved.      Hypertension Hyperlipidemia Not on BP medicine.  BP high normal here.  AKI Creatinine 1.5 on admission, now resolved to baseline 0.9  Transaminitis Hepatitis serologies negative.  Mild transaminitis without synthetic liver dysfunction is common in COVID, likely self-limited.  Will need follow up LFTs in 2-3 months to verify return to normal.              Discharge Instructions  Discharge Instructions    Diet general   Complete by: As directed    Discharge instructions   Complete by: As directed    You were admitted for coronavirus (Also known as COVID-19)  You were treated with an anti-virus medicine ("remdesivir") and an anti-inflammatory (a "steroid") while you were here.  You completed the course of both while you were here.    If you have any lingering cough, you should take the cough syrup  we gave you here, Robitussin (with the ingredients "GUIAFENESIN" and "DEXTROMETHORPHAN")    HOW LONG TO REMAIN IN QUARANTINE: There is some uncertainty about this. The CDC and our local health departments recommend you isolate strictly until 10 days from your first symptoms AND at least 1 day from your last fever AND until your symptoms are resolved. My personal recommendation is more strict, and I recommend you isolate for 7 days from the time you were discharged.  If you have anyone in the home who has NOT had coronavirus:    -do not be in the same room with them until your self isolation is over    -wear a mask and have them wear a mask if you MUST be in the same room    -clean all hard surfaces (counters, doors, tables) twice a day    -use a separate bathroom at all times   Call your primary care doctor for a phone appointment or an in-person follow up in the next 1-2 weeks. Have them recheck your liver function tests in 2 months to make sure they are back to normal.   Increase activity slowly   Complete by: As directed      Allergies as of 02/26/2019   No Known Allergies     Medication List    TAKE these medications   acetaminophen 500 MG tablet Commonly known as: TYLENOL Take 500 mg by mouth every 6 (six) hours as needed for fever or headache.   atorvastatin 20 MG tablet Commonly known as: LIPITOR  TAKE 1 TABLET (20 MG TOTAL) BY MOUTH DAILY AT 6 PM. What changed: See the new instructions.   cholecalciferol 25 MCG (1000 UT) tablet Commonly known as: VITAMIN D3 Take 1,000 Units by mouth daily.   Fenofibric Acid 135 MG Cpdr TAKE 1 CAPSULE BY MOUTH EVERY DAY   ibuprofen 200 MG tablet Commonly known as: ADVIL Take 400 mg by mouth every 6 (six) hours as needed for fever.      Follow-up Information    Lucille Passy, MD Follow up.   Specialty: Family Medicine Contact information: [place] [address] [phone]          No Known  Allergies  Consultations:  None   Procedures/Studies: Dg Chest Portable 1 View  Result Date: 02/21/2019 CLINICAL DATA:  Worsening fever, weakness, covid symptoms x 2 weeks, states 2 family members tested positive EXAM: PORTABLE CHEST 1 VIEW COMPARISON:  None. FINDINGS: Heart size is accentuated by technique and normal. There is faint patchy opacity at the LEFT lung base and the RIGHT lung apex no pulmonary edema. IMPRESSION: Multifocal infiltrates. Electronically Signed   By: [name] Nations M.D.   On: 02/21/2019 12:10      Subjective: Feeling well.  No hemopytsis, chest pain.  No dyspnea, no fatigue, no dizziness.  No vomiting.  Discharge Exam: Vitals:   02/26/19 0721 02/26/19 0805  BP: 119/78   Pulse:  (!) 55  Resp: 19   Temp: 98.2 F (36.8 C)   SpO2:  96%   Vitals:   02/26/19 0447 02/26/19 0500 02/26/19 0721 02/26/19 0805  BP: 106/60  119/78   Pulse: (!) 56   (!) 55  Resp: 20  19   Temp: 98.4 F (36.9 C)  98.2 F (36.8 C)   TempSrc: Oral  Oral   SpO2: 93%   96%  Weight:  86.6 kg    Height:        General: Pt is alert, awake, not in acute distress Cardiovascular: RRR, nl S1-S2, no murmurs appreciated.   No LE edema.   Respiratory: Normal respiratory rate and rhythm.  CTAB without rales or wheezes. Abdominal: Abdomen soft and non-tender.  No distension or HSM.   Neuro/Psych: Strength symmetric in upper and lower extremities.  Judgment and insight appear normal.   The results of significant diagnostics from this hospitalization (including imaging, microbiology, ancillary and laboratory) are listed below for reference.     Microbiology: Recent Results (from the past 240 hour(s))  SARS Coronavirus 2 (CEPHEID- Performed in [facility] hospital lab), Hosp Order     Status: Abnormal   Collection Time: 02/21/19  2:07 PM   Specimen: Nasopharyngeal Swab  Result Value Ref Range Status   SARS Coronavirus 2 POSITIVE (A) NEGATIVE Final    Comment: RESULT CALLED TO,  READ BACK BY AND VERIFIED WITH: Tsosie Billing [id number] @ [place] (NOTE) If result is NEGATIVE SARS-CoV-2 target nucleic acids are NOT DETECTED. The SARS-CoV-2 RNA is generally detectable in upper and lower  respiratory specimens during the acute phase of infection. The lowest  concentration of SARS-CoV-2 viral copies this assay can detect is 250  copies / mL. A negative result does not preclude SARS-CoV-2 infection  and should not be used as the sole basis for treatment or other  patient management decisions.  A negative result may occur with  improper specimen collection / handling, submission of specimen other  than nasopharyngeal swab, presence of viral mutation(s) within the  areas  targeted by this assay, and inadequate number of viral copies  (<250 copies / mL). A negative result must be combined with clinical  observations, patient history, and epidemiological information. If result is POSITIVE SARS-CoV-2 target nucleic acids are DET ECTED. The SARS-CoV-2 RNA is generally detectable in upper and lower  respiratory specimens during the acute phase of infection.  Positive  results are indicative of active infection with SARS-CoV-2.  Clinical  correlation with patient history and other diagnostic information is  necessary to determine patient infection status.  Positive results do  not rule out bacterial infection or co-infection with other viruses. If result is PRESUMPTIVE POSTIVE SARS-CoV-2 nucleic acids MAY BE PRESENT.   A presumptive positive result was obtained on the submitted specimen  and confirmed on repeat testing.  While 2019 novel coronavirus  (SARS-CoV-2) nucleic acids may be present in the submitted sample  additional confirmatory testing may be necessary for epidemiological  and / or clinical management purposes  to differentiate between  SARS-CoV-2 and other Sarbecovirus currently known to infect humans.  If clinically indicated additional testing with  an alternate test  methodology ([place]) is advised. The SARS-CoV-2 RNA is generally  detectable in upper and lower respiratory specimens during the acute  phase of infection. The expected result is Negative. Fact Sheet for Patients:  StrictlyIdeas.no Fact Sheet for Healthcare Providers: BankingDealers.co.za This test is not yet approved or cleared by the [place] FDA and has been authorized for detection and/or diagnosis of SARS-CoV-2 by FDA under an Emergency Use Authorization (EUA).  This EUA will remain in effect (meaning this test can be used) for the duration of the COVID-19 declaration under Section 564(b)(1) of the Act, 21 U.S.C. section 360bbb-3(b)(1), unless the authorization is terminated or revoked sooner. Performed at [facility], [place] [address] [place], [place] [address]   Culture, blood (Routine X 2) w Reflex to ID Panel     Status: None   Collection Time: 02/21/19  2:43 PM   Specimen: BLOOD RIGHT HAND  Result Value Ref Range Status   Specimen Description   Final    BLOOD RIGHT HAND Performed at [facility] [address], [place], [place] [address]    Special Requests   Final    BOTTLES DRAWN AEROBIC AND ANAEROBIC Blood Culture adequate volume Performed at [facility] [address], [place], [place] [address]    Culture   Final    NO GROWTH 5 DAYS Performed at [facility], [place] [address] [place], [place] [address]    Report Status 02/26/2019 FINAL  Final  Culture, blood (Routine X 2) w Reflex to ID Panel     Status: None   Collection Time: 02/21/19  2:48 PM   Specimen: BLOOD  Result Value Ref Range Status   Specimen Description   Final    BLOOD RIGHT ANTECUBITAL Performed at [facility], [place] [address], [place], [place] [address]    Special Requests   Final    BOTTLES DRAWN AEROBIC AND ANAEROBIC Blood Culture adequate  volume Performed at [facility] [address] [place], [place] [address]    Culture   Final    NO GROWTH 5 DAYS Performed at [facility], [place] [address] [place],  [address]    Report Status 02/26/2019 FINAL  Final     Labs: BNP (last 3 results) Recent Labs    02/21/19 1135  BNP 104.9*  Basic Metabolic Panel: Recent Labs  Lab 02/22/19 0230 02/23/19 0230 02/24/19 0415 02/25/19 0113 02/26/19 0110  NA 137 139 142 139 137  K 4.3 4.0 4.5 4.0 4.2  CL 105 108 110 106 101  CO2 22 21* 22 25 25   GLUCOSE 111* 130* 136* 119* 119*  BUN 25* 21* 23* 25* 28*  CREATININE 1.11 0.86 0.93 0.92 1.02  CALCIUM 8.7* 8.7* 8.9 9.0 8.8*  MG 2.1 2.1 2.1 2.1 2.3  PHOS 1.6* 2.2* 3.6  --   --    Liver Function Tests: Recent Labs  Lab 02/22/19 0230 02/23/19 0230 02/24/19 0415 02/25/19 0113 02/26/19 0110  AST 45* 64* 51* 95* 64*  ALT 40 67* 82* 180* 181*  ALKPHOS 86 74 68 66 66  BILITOT 0.7 0.4 0.5 0.6 0.7  PROT 6.8 6.3* 6.0* 6.3* 6.4*  ALBUMIN 3.1* 2.8* 2.8* 3.0* 3.2*   No results for input(s): LIPASE, AMYLASE in the last 168 hours. No results for input(s): AMMONIA in the last 168 hours. CBC: Recent Labs  Lab 02/22/19 0230 02/23/19 0230 02/24/19 0415 02/25/19 0113 02/26/19 0110  WBC 8.8 8.7 11.1* 10.4 9.2  NEUTROABS 7.7 7.2 9.4* 7.9* 6.3  HGB 13.2 12.3* 12.0* 12.8* 12.9*  HCT 40.0 37.6* 36.7* 38.6* 39.4  MCV 89.9 90.2 90.4 88.9 89.3  PLT 263 280 297 322 324   Cardiac Enzymes: Recent Labs  Lab 02/22/19 0230 02/23/19 0230 02/24/19 0415  CKTOTAL 79 70 52   BNP: Invalid input(s): POCBNP CBG: No results for input(s): GLUCAP in the last 168 hours. D-Dimer Recent Labs    02/24/19 0415  DDIMER 0.36   Hgb A1c No results for input(s): HGBA1C in the last 72 hours. Lipid Profile No results for input(s): CHOL, HDL, LDLCALC, TRIG, CHOLHDL, LDLDIRECT in the last 72 hours. Thyroid function studies No results for input(s): TSH, T4TOTAL,  T3FREE, THYROIDAB in the last 72 hours.  Invalid input(s): FREET3 Anemia work up Recent Labs    02/24/19 0415  FERRITIN 1,071*   Urinalysis    Component Value Date/Time   COLORURINE Yellow 11/24/2011 2203   COLORURINE yellow 08/09/2009 0858   APPEARANCEUR Clear 11/24/2011 2203   LABSPEC 1.019 11/24/2011 2203   PHURINE 6.0 11/24/2011 2203   PHURINE 6.5 08/09/2009 0858   GLUCOSEU Negative 11/24/2011 2203   HGBUR Negative 11/24/2011 2203   HGBUR moderate 08/09/2009 0858   BILIRUBINUR negative 10/09/2016 1302   BILIRUBINUR Negative 11/24/2011 2203   KETONESUR Negative 11/24/2011 2203   PROTEINUR negative 10/09/2016 1302   PROTEINUR Negative 11/24/2011 2203   UROBILINOGEN 0.2 10/09/2016 1302   UROBILINOGEN 0.2 08/09/2009 0858   NITRITE negative 10/09/2016 1302   NITRITE Negative 11/24/2011 2203   NITRITE negative 08/09/2009 0858   LEUKOCYTESUR Negative 11/24/2011 2203   Sepsis Labs Invalid input(s): PROCALCITONIN,  WBC,  LACTICIDVEN Microbiology Recent Results (from the past 240 hour(s))  SARS Coronavirus 2 (CEPHEID- Performed in [facility] hospital lab), Hosp Order     Status: Abnormal   Collection Time: 02/21/19  2:07 PM   Specimen: Nasopharyngeal Swab  Result Value Ref Range Status   SARS Coronavirus 2 POSITIVE (A) NEGATIVE Final    Comment: RESULT CALLED TO, READ BACK BY AND VERIFIED WITH: Tsosie Billing [id number] @ [place] (NOTE) If result is NEGATIVE SARS-CoV-2 target nucleic acids are NOT DETECTED. The SARS-CoV-2 RNA is generally detectable in upper and lower  respiratory specimens during the acute phase of infection. The lowest  concentration of SARS-CoV-2 viral copies this  assay can detect is 250  copies / mL. A negative result does not preclude SARS-CoV-2 infection  and should not be used as the sole basis for treatment or other  patient management decisions.  A negative result may occur with  improper specimen collection / handling, submission of  specimen other  than nasopharyngeal swab, presence of viral mutation(s) within the  areas targeted by this assay, and inadequate number of viral copies  (<250 copies / mL). A negative result must be combined with clinical  observations, patient history, and epidemiological information. If result is POSITIVE SARS-CoV-2 target nucleic acids are DET ECTED. The SARS-CoV-2 RNA is generally detectable in upper and lower  respiratory specimens during the acute phase of infection.  Positive  results are indicative of active infection with SARS-CoV-2.  Clinical  correlation with patient history and other diagnostic information is  necessary to determine patient infection status.  Positive results do  not rule out bacterial infection or co-infection with other viruses. If result is PRESUMPTIVE POSTIVE SARS-CoV-2 nucleic acids MAY BE PRESENT.   A presumptive positive result was obtained on the submitted specimen  and confirmed on repeat testing.  While 2019 novel coronavirus  (SARS-CoV-2) nucleic acids may be present in the submitted sample  additional confirmatory testing may be necessary for epidemiological  and / or clinical management purposes  to differentiate between  SARS-CoV-2 and other Sarbecovirus currently known to infect humans.  If clinically indicated additional testing with an alternate test  methodology ([place]) is advised. The SARS-CoV-2 RNA is generally  detectable in upper and lower respiratory specimens during the acute  phase of infection. The expected result is Negative. Fact Sheet for Patients:  StrictlyIdeas.no Fact Sheet for Healthcare Providers: BankingDealers.co.za This test is not yet approved or cleared by the [place] FDA and has been authorized for detection and/or diagnosis of SARS-CoV-2 by FDA under an Emergency Use Authorization (EUA).  This EUA will remain in effect (meaning this test can be used) for the  duration of the COVID-19 declaration under Section 564(b)(1) of the Act, 21 U.S.C. section 360bbb-3(b)(1), unless the authorization is terminated or revoked sooner. Performed at [facility], [place] [address], [place], [place] [address]   Culture, blood (Routine X 2) w Reflex to ID Panel     Status: None   Collection Time: 02/21/19  2:43 PM   Specimen: BLOOD RIGHT HAND  Result Value Ref Range Status   Specimen Description   Final    BLOOD RIGHT HAND Performed at [facility] [address], [place], [place] [address]    Special Requests   Final    BOTTLES DRAWN AEROBIC AND ANAEROBIC Blood Culture adequate volume Performed at [facility] [address], [place], [place] [address]    Culture   Final    NO GROWTH 5 DAYS Performed at [facility], [place] [address], [place], [place] [address]    Report Status 02/26/2019 FINAL  Final  Culture, blood (Routine X 2) w Reflex to ID Panel     Status: None   Collection Time: 02/21/19  2:48 PM   Specimen: BLOOD  Result Value Ref Range Status   Specimen Description   Final    BLOOD RIGHT ANTECUBITAL Performed at [facility], [place] [address] [place], [place] [address]    Special Requests   Final    BOTTLES DRAWN AEROBIC AND ANAEROBIC Blood Culture adequate volume Performed at [facility], [place] [address], [name], [place] [address]    Culture   Final    NO GROWTH 5 DAYS Performed at [facility], [place] [address] [place], [place] [address]    Report Status 02/26/2019 FINAL  Final     Time coordinating discharge: 40 minutes      SIGNED:   Edwin Dada, MD  [facility] 02/26/2019, 11:42 AM

## 2019-02-26 NOTE — Discharge Instructions (Signed)
COVID-19: Cmo protegerse y proteger a los dems COVID-19: How to Protect Yourself and Others Sepa cmo se propaga  Actualmente, no existe ninguna vacuna para prevenir la enfermedad por coronavirus 2019 (COVID-19).  La mejor forma de prevenir la enfermedad es evitar exponerse a este virus.  Se cree que el virus se transmite principalmente de [place] persona a [place]. ? [employer] que estn en contacto directo entre s (a una distancia inferior a 6 pies [1.30m]). ? A travs de las gotitas respiratorias producidas cuando una persona infectada tose, estornuda o habla. ? Estas gotitas pueden caer en la boca o en la nariz de las personas que estn cerca o pueden ser [employer] pulmones. ? Algunos estudios recientes sugieren que la COVID-19 puede ser transmitida por personas que no presentan sntomas. Lo que todos deben hacer [employer] las manos con frecuencia  Lvese las manos con frecuencia con agua y jabn durante al menos 20 segundos, especialmente despus de Investment banker, corporate en un lugar pblico o despus de sonarse la nariz, toser o estornudar.  Si no dispone de [place] y [place], use un desinfectante de manos que contenga al menos un 60% de alcohol. [place] y frtelas hasta que se sientan secas.  No se toque los ojos, la nariz y la boca sin antes lavarse las manos. Evite el contacto cercano  Qudese en casa si est enfermo.  Evite el contacto cercano con personas que estn enfermas.  Establezca distancia entre usted y [employer]. ? Recuerde que [employer] no tienen sntomas pueden transmitir el virus. ? Esto es Scientist, research (life sciences) para las personas que tienen ms riesgo de ToyProtection.fr Cbrase la boca y la nariz con un barbijo de tela cuando est cerca de otras personas  Puede transmitir la COVID-19 a Producer, television/film/video aunque no se sienta  enfermo.  Todas las personas deben usar un barbijo de tela cuando tengan que ir a un lugar pblico, por ejemplo, al supermercado o a buscar otros productos necesarios. ? Los barbijos de tela no deben colocarse a nios menores de 2 aos de [place], a las personas que tienen problemas respiratorios o que estn inconscientes, incapacitadas o que por algn motivo no puedan quitarse la mascarilla sin [place].  El propsito del barbijo de tela es proteger a Producer, television/film/video en caso de que usted est infectado.  NO utilice las [place] a los trabajadores de [employer]	[place].  Contine manteniendo una distancia aproximada de 6 pies (1.73m) entre usted y [employer]. El barbijo de tela no reemplaza el distanciamiento social. Rachel Moulds al toser y estornudar  Si est en un ambiente privado y no tiene el barbijo de tela, recuerde siempre cubrirse la boca y la nariz con un pauelo descartable al toser o Brewing technologist, o usar el pliegue del codo.  Deseche los pauelos descartables usados en la basura.  Inmediatamente, lvese las manos con agua y [place] durante al menos 20segundos. Si no dispone de agua y [place], [employer] con un desinfectante de manos que contenga al menos un 60% de alcohol. [place]pie y desinfecte  [place]pie Y desinfecte las superficies que se tocan con frecuencia todos los das. Esto incluye mesas, picaportes, interruptores de luz, encimeras, mangos, escritorios, telfonos, teclados, inodoros, grifos y lavabos. RackRewards.fr  Si las superficies estn sucias, lmpielas: Use detergente o jabn y agua antes de la desinfeccin.  Luego, use un desinfectante domstico. Physicist, medical de los desinfectantes domsticos registrados en Conservation officer, historic buildings (EPA) (  Agencia de Proteccin Ambiental) aqu. [email or web address] 11/30/2018 Esta informacin no tiene Marine scientist el consejo del mdico.  Asegrese de hacerle al mdico cualquier pregunta que tenga. Document Released: 11/18/2018 Document Revised: 12/14/2018 Document Reviewed: 11/09/2018 Elsevier Patient Education  [place].

## 2019-02-28 ENCOUNTER — Telehealth: Payer: Self-pay | Admitting: Family Medicine

## 2019-02-28 NOTE — Telephone Encounter (Signed)
Pt daughter calling and said that pt was discharged from [facility] this past Saturday and was there since 02/21/19, The Dr there wanted him to have labs 2 weeks after discharge date is that ok to schedule

## 2019-03-01 NOTE — Telephone Encounter (Signed)
AD-After reading the Discharge Summary the patient will need 1 appointments:  1.) To schedule appointment with Dr. Deborra Medina in 1-2 weeks for a virtual/doxy.me.  2.) To schedule a lab visit for 2 months out to recheck liver functions.  Thanks/dmf

## 2019-03-01 NOTE — Telephone Encounter (Signed)
Just scheduled virtual for now

## 2019-03-07 DIAGNOSIS — Z09 Encounter for follow-up examination after completed treatment for conditions other than malignant neoplasm: Secondary | ICD-10-CM | POA: Insufficient documentation

## 2019-03-07 DIAGNOSIS — R7401 Elevation of levels of liver transaminase levels: Secondary | ICD-10-CM | POA: Insufficient documentation

## 2019-03-07 NOTE — Assessment & Plan Note (Signed)
Hepatitis serologies negative.  Mild transaminitis without synthetic liver dysfunction is common in COVID, likely self-limited.  Repeat LFTs in 2 months to make sure they are back to normal.

## 2019-03-07 NOTE — Progress Notes (Signed)
Subjective:   Patient ID: Brandon Farrell, male    DOB: 22-Jan-1961, 58 y.o.   MRN: [id number]    Virtual Visit via Video   Due to the COVID-19 pandemic, this visit was completed with telemedicine (audio/video) technology to reduce patient and provider exposure as well as to preserve personal protective equipment.   I connected with Brandon Farrell by a video enabled telemedicine application and verified that I am speaking with the correct person using two identifiers. Location patient: Home Location provider: [facility], Office Persons participating in the virtual visit: Brandon Farrell, Brandon Norris, MD. Patient's daughter  I discussed the limitations of evaluation and management by telemedicine and the availability of in person appointments. The patient expressed understanding and agreed to proceed.  Care Team   Patient Care Team: Lucille Passy, MD as PCP - General Nahser, Wonda Cheng, MD as PCP - Cardiology (Cardiology)   Subjective:    HPI:  Admitted 7/27 -02/26/2019 after he presented to ED with 10 days malaise, and COVID+ contacts. Finally developed dyspnea on exertion and fever which prompted him to go to the ED.  Tested positive for covid.  Hypoxic requiring 2L O2. CXR with LLL infiltrate.  Dg Chest Portable 1 View  Result Date: 02/21/2019 CLINICAL DATA:  Worsening fever, weakness, covid symptoms x 2 weeks, states 2 family members tested positive EXAM: PORTABLE CHEST 1 VIEW COMPARISON:  None. FINDINGS: Heart size is accentuated by technique and normal. There is faint patchy opacity at the LEFT lung base and the RIGHT lung apex no pulmonary edema. IMPRESSION: Multifocal infiltrates. Electronically Signed   By: [name] Nations M.D.   On: 02/21/2019 12:10   Diagnosed with Coronavirus pneumonitis with acute hypoxic respiratory failure In setting of ongoing 2020 COVID-19 pandemic.  Treated with steroids, 5 days remdesivir, Actemra and prophylactic dose Lovenox.  Able to wean completely  off O2.  Symptoms resolved.   AKI- Cr 1.5 on admission, returned to baseline at discharge. Lab Results  Component Value Date   CREATININE 1.02 02/26/2019   transaminitis- Hepatitis serologies negative.  Mild transaminitis without synthetic liver dysfunction is common in COVID, likely self-limited.  Lab Results  Component Value Date   ALT 181 (H) 02/26/2019   AST 64 (H) 02/26/2019   ALKPHOS 66 02/26/2019   BILITOT 0.7 02/26/2019   He is feeling much better.  Has not had a fever since  He was admitted to the hospital.  No hurting anywhere.  He is fatigued.  No SOB.    Review of Systems  Constitutional: Positive for fatigue. Negative for fever.  HENT: Negative.   Eyes: Negative.   Respiratory: Negative for cough and shortness of breath.   Cardiovascular: Negative.   Gastrointestinal: Negative.   Genitourinary: Negative.   Musculoskeletal: Negative.   Skin: Negative.   Neurological: Negative.   Psychiatric/Behavioral: Negative.   All other systems reviewed and are negative.    Patient Active Problem List   Diagnosis Date Noted  . Hospital discharge follow-up 03/07/2019  . Transaminitis 03/07/2019  . Pneumonia due to COVID-19 virus 02/21/2019  . AKI (acute kidney injury) ([place]) 02/21/2019  . External hemorrhoids 08/18/2018  . Difficulty swallowing 11/30/2017  . Post herpetic neuralgia 05/18/2017  . Left lateral knee pain 04/01/2017  . Hyperlipidemia 10/02/2014  . Palpitations 03/22/2014  . Arthritis 12/22/2013  . Family history of MI (myocardial infarction) 03/23/2012  . HYPERTRIGLYCERIDEMIA 06/18/2007  . HYPERLIPIDEMIA 06/18/2007  . OBESITY 06/11/2007  . GERD 06/11/2007  . VITILIGO 06/11/2007  Social History   Tobacco Use  . Smoking status: Never Smoker  . Smokeless tobacco: Never Used  Substance Use Topics  . Alcohol use: Yes    Alcohol/week: 3.0 standard drinks    Types: 3 Cans of beer per week    Comment: occasional    Current Outpatient  Medications:  .  acetaminophen (TYLENOL) 500 MG tablet, Take 500 mg by mouth every 6 (six) hours as needed for fever or headache., Disp: , Rfl:  .  atorvastatin (LIPITOR) 20 MG tablet, TAKE 1 TABLET (20 MG TOTAL) BY MOUTH DAILY AT 6 PM. (Patient taking differently: Take 20 mg by mouth daily. ), Disp: 90 tablet, Rfl: 2 .  cholecalciferol (VITAMIN D3) 25 MCG (1000 UT) tablet, Take 1,000 Units by mouth daily., Disp: , Rfl:  .  Choline Fenofibrate (FENOFIBRIC ACID) 135 MG CPDR, TAKE 1 CAPSULE BY MOUTH EVERY DAY, Disp: 90 capsule, Rfl: 1 .  ibuprofen (ADVIL) 200 MG tablet, Take 400 mg by mouth every 6 (six) hours as needed for fever., Disp: , Rfl:   No Known Allergies  Objective:   VITALS: Per patient if applicable, see vitals. GENERAL: Alert, appears well and in no acute distress. HEENT: Atraumatic, conjunctiva clear, no obvious abnormalities on inspection of external nose and ears. NECK: Normal movements of the head and neck. CARDIOPULMONARY: No increased WOB. Speaking in clear sentences. I:E ratio WNL.  MS: Moves all visible extremities without noticeable abnormality. PSYCH: Pleasant and cooperative, well-groomed. Speech normal rate and rhythm. Affect is appropriate. Insight and judgement are appropriate. Attention is focused, linear, and appropriate.  NEURO: CN grossly intact. Oriented as arrived to appointment on time with no prompting. Moves both UE equally.  SKIN: No obvious lesions, wounds, erythema, or cyanosis noted on face or hands.  Depression screen [facility] 2/9 08/18/2018 11/30/2017  Decreased Interest 0 0  Down, Depressed, Hopeless 0 0  PHQ - 2 Score 0 0    Assessment and Plan:   [name] was seen today for hospitalization follow-up.  Diagnoses and all orders for this visit:  Pneumonia due to COVID-19 virus  Hospital discharge follow-up  AKI (acute kidney injury) ([place]) -     Comprehensive metabolic panel; Future  Transaminitis -     Comprehensive metabolic panel; Future     . COVID-19 Education: The signs and symptoms of COVID-19 were discussed with the patient and how to seek care for testing if needed. The importance of social distancing was discussed today. . Reviewed expectations re: course of current medical issues. . Discussed self-management of symptoms. . Outlined signs and symptoms indicating need for more acute intervention. . Patient verbalized understanding and all questions were answered. Marland Kitchen Health Maintenance issues including appropriate healthy diet, exercise, and smoking avoidance were discussed with patient. . See orders for this visit as documented in the electronic medical record.  Brandon Norris, MD  Records requested if needed. Time spent: 25 minutes, of which >50% was spent in obtaining information about his symptoms, reviewing his previous labs, evaluations, and treatments, counseling him about his condition (please see the discussed topics above), and developing a plan to further investigate it; he had a number of questions which I addressed.

## 2019-03-08 ENCOUNTER — Ambulatory Visit (INDEPENDENT_AMBULATORY_CARE_PROVIDER_SITE_OTHER): Payer: BLUE CROSS/BLUE SHIELD | Admitting: Family Medicine

## 2019-03-08 ENCOUNTER — Encounter: Payer: Self-pay | Admitting: Family Medicine

## 2019-03-08 DIAGNOSIS — U071 COVID-19: Secondary | ICD-10-CM

## 2019-03-08 DIAGNOSIS — R740 Nonspecific elevation of levels of transaminase and lactic acid dehydrogenase [LDH]: Secondary | ICD-10-CM

## 2019-03-08 DIAGNOSIS — N179 Acute kidney failure, unspecified: Secondary | ICD-10-CM | POA: Diagnosis not present

## 2019-03-08 DIAGNOSIS — J1289 Other viral pneumonia: Secondary | ICD-10-CM | POA: Diagnosis not present

## 2019-03-08 DIAGNOSIS — R7401 Elevation of levels of liver transaminase levels: Secondary | ICD-10-CM

## 2019-03-08 DIAGNOSIS — Z09 Encounter for follow-up examination after completed treatment for conditions other than malignant neoplasm: Secondary | ICD-10-CM

## 2019-03-08 NOTE — Assessment & Plan Note (Signed)
Likely prerenal- check CMET on 8/18 (approx 2 weeks from discharge).

## 2019-03-08 NOTE — Assessment & Plan Note (Signed)
He is feeling much better, only has some fatigue remaining. He will schedule a follow up to see me in 2 months- will repeat CXR and CMET at that time. The patient indicates understanding of these issues and agrees with the plan.

## 2019-03-15 ENCOUNTER — Other Ambulatory Visit (INDEPENDENT_AMBULATORY_CARE_PROVIDER_SITE_OTHER): Payer: BLUE CROSS/BLUE SHIELD

## 2019-03-15 DIAGNOSIS — N179 Acute kidney failure, unspecified: Secondary | ICD-10-CM | POA: Diagnosis not present

## 2019-03-15 DIAGNOSIS — R7401 Elevation of levels of liver transaminase levels: Secondary | ICD-10-CM

## 2019-03-15 DIAGNOSIS — R740 Nonspecific elevation of levels of transaminase and lactic acid dehydrogenase [LDH]: Secondary | ICD-10-CM

## 2019-03-15 LAB — COMPREHENSIVE METABOLIC PANEL
ALT: 46 U/L (ref 0–53)
AST: 32 U/L (ref 0–37)
Albumin: 4.1 g/dL (ref 3.5–5.2)
Alkaline Phosphatase: 48 U/L (ref 39–117)
BUN: 13 mg/dL (ref 6–23)
CO2: 25 mEq/L (ref 19–32)
Calcium: 9.3 mg/dL (ref 8.4–10.5)
Chloride: 110 mEq/L (ref 96–112)
Creatinine, Ser: 1.02 mg/dL (ref 0.40–1.50)
GFR: 74.61 mL/min (ref >60.00)
Glucose, Bld: 141 mg/dL — ABNORMAL HIGH (ref 70–99)
Potassium: 4 mEq/L (ref 3.5–5.1)
Sodium: 140 mEq/L (ref 135–145)
Total Bilirubin: 0.8 mg/dL (ref 0.2–1.2)
Total Protein: 6 g/dL (ref 6.0–8.3)

## 2019-03-16 ENCOUNTER — Other Ambulatory Visit: Payer: Self-pay

## 2019-03-16 DIAGNOSIS — R739 Hyperglycemia, unspecified: Secondary | ICD-10-CM

## 2019-03-16 NOTE — Progress Notes (Signed)
Added A1C in system and faxed add on request to [place] for elevated glucose/thx dmf

## 2019-04-21 ENCOUNTER — Ambulatory Visit (INDEPENDENT_AMBULATORY_CARE_PROVIDER_SITE_OTHER): Payer: BLUE CROSS/BLUE SHIELD | Admitting: Family Medicine

## 2019-04-21 ENCOUNTER — Other Ambulatory Visit (HOSPITAL_COMMUNITY)
Admission: RE | Admit: 2019-04-21 | Discharge: 2019-04-21 | Disposition: A | Discharge status: Home / Self Care | Payer: BLUE CROSS/BLUE SHIELD | Source: Ambulatory Visit | Attending: Family Medicine | Admitting: Family Medicine

## 2019-04-21 ENCOUNTER — Other Ambulatory Visit: Payer: Self-pay | Admitting: Family Medicine

## 2019-04-21 ENCOUNTER — Ambulatory Visit (INDEPENDENT_AMBULATORY_CARE_PROVIDER_SITE_OTHER): Payer: BLUE CROSS/BLUE SHIELD

## 2019-04-21 ENCOUNTER — Other Ambulatory Visit: Payer: Self-pay

## 2019-04-21 ENCOUNTER — Encounter: Payer: Self-pay | Admitting: Family Medicine

## 2019-04-21 VITALS — BP 136/80 | HR 70 | Ht 62.0 in | Wt 201.0 lb

## 2019-04-21 DIAGNOSIS — M25532 Pain in left wrist: Secondary | ICD-10-CM

## 2019-04-21 DIAGNOSIS — N401 Enlarged prostate with lower urinary tract symptoms: Secondary | ICD-10-CM

## 2019-04-21 DIAGNOSIS — M25531 Pain in right wrist: Secondary | ICD-10-CM | POA: Diagnosis not present

## 2019-04-21 DIAGNOSIS — G8929 Other chronic pain: Secondary | ICD-10-CM

## 2019-04-21 DIAGNOSIS — M189 Osteoarthritis of first carpometacarpal joint, unspecified: Secondary | ICD-10-CM | POA: Insufficient documentation

## 2019-04-21 DIAGNOSIS — R399 Unspecified symptoms and signs involving the genitourinary system: Secondary | ICD-10-CM | POA: Insufficient documentation

## 2019-04-21 DIAGNOSIS — R3911 Hesitancy of micturition: Secondary | ICD-10-CM

## 2019-04-21 DIAGNOSIS — R300 Dysuria: Secondary | ICD-10-CM | POA: Insufficient documentation

## 2019-04-21 LAB — POCT URINALYSIS DIPSTICK
Bilirubin, UA: NEGATIVE
Blood, UA: NEGATIVE
Glucose, UA: NEGATIVE
Ketones, UA: 5
Nitrite, UA: NEGATIVE
Protein, UA: POSITIVE — AB
Urobilinogen, UA: 2 E.U./dL — AB
pH, UA: 5.5 (ref 5.0–8.0)

## 2019-04-21 LAB — URINALYSIS, ROUTINE W REFLEX MICROSCOPIC
Bilirubin Urine: NEGATIVE
Hgb urine dipstick: NEGATIVE
Ketones, ur: NEGATIVE
Leukocytes,Ua: NEGATIVE
Nitrite: NEGATIVE
RBC / HPF: NONE SEEN (ref 0–?)
Specific Gravity, Urine: >=1.03 — AB (ref 1.000–1.030)
Total Protein, Urine: NEGATIVE
Urine Glucose: NEGATIVE
Urobilinogen, UA: 0.2 (ref 0.0–1.0)
WBC, UA: NONE SEEN (ref 0–?)
pH: 6 (ref 5.0–8.0)

## 2019-04-21 MED ORDER — SULFAMETHOXAZOLE-TRIMETHOPRIM 800-160 MG PO TABS: 1.0000 | ORAL_TABLET | Freq: Two times a day (BID) | ORAL | 0 refills | Status: AC

## 2019-04-21 MED ORDER — MELOXICAM 15 MG PO TABS: 15.0000 mg | ORAL_TABLET | Freq: Every day | ORAL | 0 refills | Status: DC

## 2019-04-21 NOTE — Progress Notes (Signed)
Established Patient Office Visit  Subjective:  Patient ID: Brandon Farrell, male    DOB: 1960-10-18  Age: 58 y.o. MRN: [id number]  CC:  Chief Complaint  Patient presents with  . Urinary Tract Infection    HPI Brandon Farrell presents for evaluation and treatment of a 2 to 3-day history of urinary frequency dysuria and lower abdominal discomfort.  Patient denies discharged.  He is married and his daughter is helping to translate today.  For some time now he has had some prevoid dribbling and decrease in the force of his stream.  He is experience nocturia x1 for over a year.  He has the feeling of incomplete emptying.  He also has had a longstanding history of bilateral wrist pain.  There has been stiffness and swelling.  Pain seems to be located in his radial wrist area.  There is been no injury or injury.  He is retired.  He is not physically active at this time.  He is right-hand dominant.  Past Medical History:  Diagnosis Date  . Arthritis    left knee  . Cataract   . Cholelithiasis   . Hypercholesterolemia   . Hypertension    no per pt  . Palpitations    a. 03/2012 Ex MV: No isch/infarct;  b. 04/2012 Echo: EF 55-65%, mildly dil LA.  Marland Kitchen Vertigo     Past Surgical History:  Procedure Laterality Date  . [place]   left  . LAPAROSCOPIC CHOLECYSTECTOMY  april 2013    Family History  Problem Relation Age of Onset  . Heart disease Son   . Colon cancer Neg Hx   . Stomach cancer Neg Hx   . Esophageal cancer Neg Hx   . Rectal cancer Neg Hx     Social History   Socioeconomic History  . Marital status: Married    Spouse name: Not on file  . Number of children: 2  . Years of education: Not on file  . Highest education level: Not on file  Occupational History  . Occupation: [employer]    Employer: Self Employed  Social Needs  . Financial resource strain: Not on file  . Food insecurity    Worry: Not on file    Inability: Not on file  . Transportation needs   Medical: Not on file    Non-medical: Not on file  Tobacco Use  . Smoking status: Never Smoker  . Smokeless tobacco: Never Used  Substance and Sexual Activity  . Alcohol use: Yes    Alcohol/week: 3.0 standard drinks    Types: 3 Cans of beer per week    Comment: occasional  . Drug use: No  . Sexual activity: Not on file  Lifestyle  . Physical activity    Days per week: Not on file    Minutes per session: Not on file  . Stress: Not on file  Relationships  . Social Herbalist on phone: Not on file    Gets together: Not on file    Attends religious service: Not on file    Active member of club or organization: Not on file    Attends meetings of clubs or organizations: Not on file    Relationship status: Not on file  . Intimate partner violence    Fear of current or ex partner: Not on file    Emotionally abused: Not on file    Physically abused: Not on file    Forced sexual activity:  Not on file  Other Topics Concern  . Not on file  Social History Narrative  . Not on file    Outpatient Medications Prior to Visit  Medication Sig Dispense Refill  . acetaminophen (TYLENOL) 500 MG tablet Take 500 mg by mouth every 6 (six) hours as needed for fever or headache.    Marland Kitchen atorvastatin (LIPITOR) 20 MG tablet TAKE 1 TABLET (20 MG TOTAL) BY MOUTH DAILY AT 6 PM. (Patient taking differently: Take 20 mg by mouth daily. ) 90 tablet 2  . cholecalciferol (VITAMIN D3) 25 MCG (1000 UT) tablet Take 1,000 Units by mouth daily.    . Choline Fenofibrate (FENOFIBRIC ACID) 135 MG CPDR TAKE 1 CAPSULE BY MOUTH EVERY DAY 90 capsule 1  . ibuprofen (ADVIL) 200 MG tablet Take 400 mg by mouth every 6 (six) hours as needed for fever.     No facility-administered medications prior to visit.     No Known Allergies  ROS Review of Systems  Constitutional: Negative.   HENT: Negative.   Eyes: Negative for photophobia and visual disturbance.  Respiratory: Negative.   Cardiovascular: Negative.    Gastrointestinal: Negative.   Endocrine: Negative for polyphagia and polyuria.  Genitourinary: Positive for dysuria, frequency and urgency. Negative for difficulty urinating, discharge and hematuria.  Musculoskeletal: Positive for arthralgias.  Skin: Negative for pallor and rash.  Allergic/Immunologic: Negative for immunocompromised state.  Neurological: Negative for light-headedness and numbness.  Hematological: Does not bruise/bleed easily.  Psychiatric/Behavioral: Negative.       Objective:    Physical Exam  Constitutional: He is oriented to person, place, and time. He appears well-developed and well-nourished. No distress.  HENT:  Head: Normocephalic and atraumatic.  Right Ear: External ear normal.  Left Ear: External ear normal.  Eyes: Right eye exhibits no discharge. Left eye exhibits no discharge. No scleral icterus.  Neck: No JVD present. No tracheal deviation present.  Cardiovascular: Normal rate, regular rhythm and normal heart sounds.  Pulmonary/Chest: Effort normal and breath sounds normal. No stridor.  Abdominal: Bowel sounds are normal. He exhibits no distension. There is abdominal tenderness in the suprapubic area. There is no rebound, no guarding and no CVA tenderness.  Musculoskeletal:     Right wrist: He exhibits tenderness and swelling. He exhibits normal range of motion and no effusion.     Left wrist: He exhibits tenderness and swelling. He exhibits normal range of motion and no effusion.       Arms:  Neurological: He is alert and oriented to person, place, and time.  Skin: Skin is warm and dry. He is not diaphoretic.  Psychiatric: He has a normal mood and affect. His behavior is normal.    BP 136/80   Pulse 70   Ht 5\' 2"  (1.575 m)   Wt 201 lb (91.2 kg)   SpO2 96%   BMI 36.76 kg/m  Wt Readings from Last 3 Encounters:  04/21/19 201 lb (91.2 kg)  02/26/19 191 lb (86.6 kg)  08/18/18 201 lb 3.2 oz (91.3 kg)   BP Readings from Last 3 Encounters:   04/21/19 136/80  02/26/19 119/78  08/18/18 124/72   Guideline developer:  UpToDate (see UpToDate for funding source) Date Released: June 2014  Health Maintenance Due  Topic Date Due  . INFLUENZA VACCINE  02/26/2019    There are no preventive care reminders to display for this patient.  Lab Results  Component Value Date   TSH 3.71 02/24/2017   Lab Results  Component Value Date  WBC 9.2 02/26/2019   HGB 12.9 (L) 02/26/2019   HCT 39.4 02/26/2019   MCV 89.3 02/26/2019   PLT 324 02/26/2019   Lab Results  Component Value Date   NA 140 03/15/2019   K 4.0 03/15/2019   CO2 25 03/15/2019   GLUCOSE 141 (H) 03/15/2019   BUN 13 03/15/2019   CREATININE 1.02 03/15/2019   BILITOT 0.8 03/15/2019   ALKPHOS 48 03/15/2019   AST 32 03/15/2019   ALT 46 03/15/2019   PROT 6.0 03/15/2019   ALBUMIN 4.1 03/15/2019   CALCIUM 9.3 03/15/2019   ANIONGAP 11 02/26/2019   GFR 74.61 03/15/2019   Lab Results  Component Value Date   CHOL 104 02/23/2019   Lab Results  Component Value Date   HDL 12 (L) 02/23/2019   Lab Results  Component Value Date   LDLCALC 67 02/23/2019   Lab Results  Component Value Date   TRIG 126 02/23/2019   Lab Results  Component Value Date   CHOLHDL 8.7 02/23/2019   Lab Results  Component Value Date   HGBA1C 5.9 03/27/2011      Assessment & Plan:   Problem List Items Addressed This Visit      Genitourinary   Benign prostatic hyperplasia with urinary hesitancy     Other   UTI symptoms - Primary   Relevant Medications   sulfamethoxazole-trimethoprim (BACTRIM DS) 800-160 MG tablet   Other Relevant Orders   POCT urinalysis dipstick (Completed)   Urinalysis, Routine w reflex microscopic   Urine Culture   HIV Antibody (routine testing w rflx)   Dysuria   Relevant Medications   sulfamethoxazole-trimethoprim (BACTRIM DS) 800-160 MG tablet   Other Relevant Orders   Urinalysis, Routine w reflex microscopic   Urine Culture   Urine cytology  ancillary only   HIV Antibody (routine testing w rflx)   Wrist pain, chronic, right   Relevant Medications   meloxicam (MOBIC) 15 MG tablet   Other Relevant Orders   DG Wrist Complete Right      Meds ordered this encounter  Medications  . meloxicam (MOBIC) 15 MG tablet    Sig: Take 1 tablet (15 mg total) by mouth daily.    Dispense:  30 tablet    Refill:  0  . sulfamethoxazole-trimethoprim (BACTRIM DS) 800-160 MG tablet    Sig: Take 1 tablet by mouth 2 (two) times daily for 10 days.    Dispense:  20 tablet    Refill:  0    Follow-up: Return Patient has follow up appointment with Dr. Marjory Lies in October..    Patient was given information on BPH in Spanish.  Trial of meloxicam for his wrist pain.  He has a scheduled appointment with Dr. Marjory Lies in October to follow-up.

## 2019-04-21 NOTE — Patient Instructions (Signed)
Hiperplasia prosttica benigna Benign Prostatic Hyperplasia  La hiperplasia prosttica benigna (HBP) es un aumento del tamao de la prstata que es causado por el proceso de envejecimiento normal y no por Science writer. La prstata es una glndula del tamao de una nuez que participa en la produccin de semen. Est ubicada frente al recto y debajo de la vejiga. La vejiga almacena orina, y la uretra es el tubo que transporta la orina desde la vejiga hasta el exterior del cuerpo. La prstata puede aumentar de tamao a medida que un hombre envejece. Una prstata agrandada puede presionar la uretra. Esto puede dificultar el pasaje de la orina. La acumulacin de orina en la vejiga puede causar una infeccin. La presin y la infeccin pueden provocar daos en la vejiga y una insuficiencia en los riones (renal). Cules son las causas? Esta afeccin es una parte normal del proceso de envejecimiento. Sin embargo, no todos los hombres desarrollan problemas por esta afeccin. Si la prstata se agranda lejos de la uretra, el flujo de orina no se obstruir. Si se agranda hacia la uretra y la comprime, habr problemas con el paso de la [place]. Qu incrementa el riesgo? Es ms probable que esta afeccin se manifieste en hombres mayores de [id number]. Cules son los signos o los sntomas? Los sntomas de esta afeccin incluyen:  Levantarse con frecuencia durante la noche para orinar.  Necesidad de Garment/textile technologist con ms frecuencia Agricultural consultant.  Dificultad para comenzar a eliminar la orina.  Disminucin del tamao y de la fuerza del chorro de [place].  Prdida (goteo) luego de Garment/textile technologist.  Imposibilidad para orinar. En este caso, es necesario un tratamiento inmediato.  Imposibilidad para vaciar la vejiga completamente.  Dolor al [place]. Esto es ms comn si tambin hay una infeccin.  Infeccin de las vas urinarias (IU). Cmo se diagnostica? Esta afeccin se diagnostica en funcin de los antecedentes mdicos, un  examen fsico y los sntomas. Tambin se le realizarn pruebas, por ejemplo:  Un estudio despus de vaciar la vejiga. Este mide la cantidad de orina que queda en la vejiga despus de terminar de Garment/textile technologist.  Examen rectal digital. En un examen rectal, el mdico controla la prstata colocando un dedo lubricado y enguantado en el recto para sentir la parte posterior de la prstata. Este examen detecta el tamao de la glndula y si hay algn bulto o crecimiento anormal.  Anlisis de orina (urinlisis).  Estudio de antgeno prosttico especfico (PSA). Este es un anlisis de sangre que se [place] para Electrical engineer de prstata.  Una ecografa. En [employer], se utilizan ondas de sonido para producir de Publishing copy una imagen de la prstata. El mdico puede derivarlo a Teaching laboratory technician en enfermedades del rin y de la prstata (urlogo). Cmo se trata? Una vez que los sntomas comienzan, el mdico controlar la afeccin (vigilancia activa u observacin cautelosa). El tratamiento depender de la gravedad de la afeccin. El tratamiento puede incluir:  Observacin y exmenes anuales. Este puede ser el nico tratamiento necesario si la afeccin y los sntomas son leves.  Medicamentos para [employer], entre los que se incluyen los siguientes: ? Medicamentos para Contractor. ? Medicamentos para relajar el msculo de la prstata.  Dwaine Gale, solo [employer] son graves. La ciruga puede incluir lo siguiente: ? Prostatectoma. En este procedimiento, se extrae el tejido de la prstata completamente, a travs de una incisin [place], con un laparoscopio o con robtica. ? Reseccin transuretral de la prstata (RTUP). En este  procedimiento, se inserta una herramienta a travs de la abertura en la punta del pene (uretra). Se utiliza para cortar tejido del centro interior de la prstata. Los trozos se retiran a Designer, jewellery de la misma abertura del pene. De este modo, se libera la  obstruccin. ? Incisin transuretral (ITUP). En este procedimiento, se hacen pequeos cortes en la prstata. Esto alivia la presin de la prstata sobre la uretra. ? Termoterapia transuretral con microondas (TTUM). En este procedimiento, se utilizan microondas para Special educational needs teacher. El calor destruye y extirpa Ardelia Mems pequea cantidad de tejido prosttico. ? Ablacin transuretral con aguja (ATUA). En este procedimiento, se utiliza la radiofrecuencia para destruir y extirpar una pequea cantidad de tejido prosttico. ? Coagulacin intersticial con lser (CIL). En este procedimiento, se utiliza un lser para destruir y extirpar una pequea cantidad de tejido prosttico. ? Electrovaporizacin transuretral (EVTU). En este procedimiento, se utilizan electrodos para destruir y extirpar una pequea cantidad de tejido prosttico. ? Liberacin uretral prosttica. En este procedimiento, se inserta un implante para ejercer presin en los lbulos de la prstata, en direccin contraria a la uretra. Siga estas indicaciones en su casa:  Tome los medicamentos de venta libre y los recetados solamente como se lo haya indicado el mdico.  Controle si hay cambios en los sntomas. Hable con su mdico antes de hacer cualquier cambio.  Evite beber grandes cantidades de lquido antes de irse a la cama o de salir de su casa.  Evite o reduzca la cantidad de cafena o alcohol que consume.  Tmese tiempo para orinar.  Concurra a todas las visitas de seguimiento como se lo haya indicado el mdico. Esto es importante. Comunquese con un mdico si:  Siente dolor en la espalda sin explicacin.  Los sntomas no mejoran con Dispensing optician.  Los [employer] causan efectos secundarios.  La orina se vuelve muy oscura o tiene mal olor.  Siente que la parte inferior del abdomen est distendida y tiene problemas para Garment/textile technologist. Solicite ayuda inmediatamente si:  Tiene fiebre o escalofros.  De repente no [employer].  Se  siente mareado, ligeramente aturdido o se desmaya.  Observa gran cantidad de sangre o cogulos sanguneos en la orina.  Sus problemas urinarios se vuelven difciles de Chief Technology Officer.  Siente dolor moderado a intenso en la espalda o en la fosa lumbar. La fosa lumbar es el costado del cuerpo entre las costillas y la cadera. Estos sntomas pueden representar un problema grave que constituye Engineer, maintenance (IT). No espere a ver si los sntomas desaparecen. Solicite atencin mdica de inmediato. Comunquese con el servicio de emergencias de su localidad (911 en los [place]). No conduzca por sus propios medios [employer] hospital. Resumen  La hiperplasia prosttica benigna (HBP) es un aumento del tamao de la prstata que es causado por el proceso de envejecimiento normal y no por Science writer.  Una prstata agrandada puede presionar la uretra. Esto puede dificultar el paso de la [place].  Esta afeccin es parte del proceso de envejecimiento normal y es ms probable que se manifieste en hombres mayores de [id number].  Obtenga atencin de inmediato si de repente no puede orinar. Esta informacin no tiene Marine scientist el consejo del mdico. Asegrese de hacerle al mdico cualquier pregunta que tenga. Document Released: 07/14/2005 Document Revised: 07/29/2018 Document Reviewed: 07/29/2018 Elsevier Patient Education  2020 [employer].

## 2019-04-22 LAB — URINE CULTURE
MICRO NUMBER:: 918649
Result:: NO GROWTH
SPECIMEN QUALITY:: ADEQUATE

## 2019-04-22 LAB — HIV ANTIBODY (ROUTINE TESTING W REFLEX): HIV 1&2 Ab, 4th Generation: NONREACTIVE

## 2019-04-22 LAB — URINE CYTOLOGY ANCILLARY ONLY
Chlamydia: NEGATIVE
Neisseria Gonorrhea: NEGATIVE

## 2019-04-29 ENCOUNTER — Other Ambulatory Visit: Payer: Self-pay

## 2019-04-29 ENCOUNTER — Ambulatory Visit (INDEPENDENT_AMBULATORY_CARE_PROVIDER_SITE_OTHER): Payer: BLUE CROSS/BLUE SHIELD | Admitting: Family Medicine

## 2019-04-29 ENCOUNTER — Ambulatory Visit: Payer: Self-pay

## 2019-04-29 ENCOUNTER — Encounter: Payer: Self-pay | Admitting: Family Medicine

## 2019-04-29 DIAGNOSIS — R42 Dizziness and giddiness: Secondary | ICD-10-CM

## 2019-04-29 MED ORDER — ONDANSETRON 4 MG PO TBDP: 4.0000 mg | ORAL_TABLET | Freq: Three times a day (TID) | ORAL | 0 refills | Status: AC | PRN

## 2019-04-29 MED ORDER — MECLIZINE HCL 25 MG PO TABS: 25.0000 mg | ORAL_TABLET | Freq: Three times a day (TID) | ORAL | 1 refills | Status: AC | PRN

## 2019-04-29 NOTE — Progress Notes (Signed)
Virtual Visit via Video Note  I connected with Brandon Farrell on 04/29/19 at  9:30 AM EDT by a video enabled telemedicine application and verified that I am speaking with the correct person using two identifiers. Location patient: home Location provider: work  Persons participating in the virtual visit: patient, provider, patient's daughter  I discussed the limitations of evaluation and management by telemedicine and the availability of in person appointments. The patient expressed understanding and agreed to proceed.  Chief Complaint  Patient presents with  . Dizziness    Pt agrees to virtual visit. Vertigo started last night. States that "everything is spinning and it is constant." He has had N&V this am when he woke. Denies any sinus, cough, or ear Sx. He saw Dr. Ethelene Hal for a UTI and was Rx'ed Bactrim DS and is taking until 10.4.20.   Pts daughter is present and translates for this encounter with pts consent.   HPI: Brandon Farrell is a 58 y.o. male who is a patient of Dr. Deborra Medina and complains of dizziness that began last night. He has nausea and 1 episode of vomiting this AM. He has been laying in bed today.  Pt denies HA, CP, SOB, vision changes. No weakness. No issues with speech. No fever, chills. No ear pain, facial pressure, nasal congestion. Pt does have a h/o vertigo. He saw ENT a few years ago.   Pt was Rx'd bactrim by Dr. Ethelene Hal on 04/21/19 for UTI symptoms.pt is still taking med but urine cx was negative/no growth resulted on 04/22/19.  Past Medical History:  Diagnosis Date  . Arthritis    left knee  . Cataract   . Cholelithiasis   . Hypercholesterolemia   . Hypertension    no per pt  . Palpitations    a. 03/2012 Ex MV: No isch/infarct;  b. 04/2012 Echo: EF 55-65%, mildly dil LA.  Marland Kitchen Vertigo     Past Surgical History:  Procedure Laterality Date  . [place]   left  . LAPAROSCOPIC CHOLECYSTECTOMY  april 2013    Family History  Problem Relation Age of Onset  .  Heart disease Son   . Colon cancer Neg Hx   . Stomach cancer Neg Hx   . Esophageal cancer Neg Hx   . Rectal cancer Neg Hx     Social History   Tobacco Use  . Smoking status: Never Smoker  . Smokeless tobacco: Never Used  Substance Use Topics  . Alcohol use: Yes    Alcohol/week: 3.0 standard drinks    Types: 3 Cans of beer per week    Comment: occasional  . Drug use: No     Current Outpatient Medications:  .  acetaminophen (TYLENOL) 500 MG tablet, Take 500 mg by mouth every 6 (six) hours as needed for fever or headache., Disp: , Rfl:  .  atorvastatin (LIPITOR) 20 MG tablet, TAKE 1 TABLET (20 MG TOTAL) BY MOUTH DAILY AT 6 PM. (Patient taking differently: Take 20 mg by mouth daily. ), Disp: 90 tablet, Rfl: 2 .  cholecalciferol (VITAMIN D3) 25 MCG (1000 UT) tablet, Take 1,000 Units by mouth daily., Disp: , Rfl:  .  Choline Fenofibrate (FENOFIBRIC ACID) 135 MG CPDR, TAKE 1 CAPSULE BY MOUTH EVERY DAY, Disp: 90 capsule, Rfl: 1 .  ibuprofen (ADVIL) 200 MG tablet, Take 400 mg by mouth every 6 (six) hours as needed for fever., Disp: , Rfl:  .  meloxicam (MOBIC) 15 MG tablet, Take 1 tablet (15 mg total)  by mouth daily., Disp: 30 tablet, Rfl: 0 .  sulfamethoxazole-trimethoprim (BACTRIM DS) 800-160 MG tablet, Take 1 tablet by mouth 2 (two) times daily for 10 days., Disp: 20 tablet, Rfl: 0  No Known Allergies    ROS: See pertinent positives and negatives per HPI.   EXAM:  VITALS per patient if applicable: There were no vitals taken for this visit.   GENERAL: alert, oriented, laying in bed  HEENT: atraumatic, conjunctiva clear, no obvious abnormalities on inspection of external nose and ears  NECK: normal movements of the head and neck  LUNGS: on inspection no signs of respiratory distress, breathing rate appears normal, no obvious gross SOB, gasping or wheezing, no conversational dyspnea  CV: no obvious cyanosis  PSYCH/NEURO: cooperative, answers questions appropriately via his  daughter who translates, pt is laying in bed, does not feel he his able to    ASSESSMENT AND PLAN:  1. Vertigo - known condition for pt, has seen ENT in the past for eval/work-up - supportive care - rest, fluids Rx: - meclizine (ANTIVERT) 25 MG tablet; Take 1 tablet (25 mg total) by mouth 3 (three) times daily as needed for dizziness.  Dispense: 30 tablet; Refill: 1 - ondansetron (ZOFRAN-ODT) 4 MG disintegrating tablet; Take 1 tablet (4 mg total) by mouth every 8 (eight) hours as needed for nausea or vomiting.  Dispense: 30 tablet; Refill: 0 - if symptoms worsen, pt is not able to tolerate any fluids, or new symptoms such as headache, CP, SOB, fever develop, pt and his daughter understand he will need to seek immediate medical care in the ER. - f/u in 1 week with PCP if symptoms do not greatly improve/resolve - pt to stop bactrim (Rx'd for UTI symptoms) as urine culture showed no growth Discussed plan and reviewed medications with patient and his daughter, including risks, benefits, and potential side effects. Pt and daughter expressed understand. All questions answered.   I discussed the assessment and treatment plan with the patient. The patient was provided an opportunity to ask questions and all were answered. The patient agreed with the plan and demonstrated an understanding of the instructions.   The patient was advised to call back or seek an in-person evaluation if the symptoms worsen or if the condition fails to improve as anticipated.   Letta Median, DO

## 2019-04-29 NOTE — Telephone Encounter (Signed)
Pt.'s daughter, Mickel Baas, reports pt. Started having dizziness last night. This morning has vertigo, nausea and vomiting. Requests virtual visit. Warm transfer to [facility] in the practice.  Answer Assessment - Initial Assessment Questions 1. DESCRIPTION: "Describe your dizziness."     Vertigo 2. VERTIGO: "Do you feel like either you or the room is spinning or tilting?"      Yes 3. LIGHTHEADED: "Do you feel lightheaded?" (e.g., somewhat faint, woozy, weak upon standing)     Weak with standing 4. SEVERITY: "How bad is it?"  "Can you walk?"   - MILD - Feels unsteady but walking normally.   - MODERATE - Feels very unsteady when walking, but not falling; interferes with normal activities (e.g., school, work) .   - SEVERE - Unable to walk without falling (requires assistance).     Moderate 5. ONSET:  "When did the dizziness begin?"     Last night 6. AGGRAVATING FACTORS: "Does anything make it worse?" (e.g., standing, change in head position)     Standing 7. CAUSE: "What do you think is causing the dizziness?"     Vertigo 8. RECURRENT SYMPTOM: "Have you had dizziness before?" If so, ask: "When was the last time?" "What happened that time?"     Yes- 2-3 years ago 9. OTHER SYMPTOMS: "Do you have any other symptoms?" (e.g., headache, weakness, numbness, vomiting, earache)     Nausea, vomiting 10. PREGNANCY: "Is there any chance you are pregnant?" "When was your last menstrual period?"       n/a  Protocols used: DIZZINESS - VERTIGO-A-AH

## 2019-05-11 ENCOUNTER — Inpatient Hospital Stay: Payer: BLUE CROSS/BLUE SHIELD | Admitting: Family Medicine

## 2019-05-14 ENCOUNTER — Other Ambulatory Visit: Payer: Self-pay | Admitting: Family Medicine

## 2019-05-14 DIAGNOSIS — M25531 Pain in right wrist: Secondary | ICD-10-CM

## 2019-05-14 DIAGNOSIS — G8929 Other chronic pain: Secondary | ICD-10-CM

## 2019-05-17 NOTE — Progress Notes (Signed)
Subjective:   Patient ID: Brandon Farrell, male    DOB: 1960/11/29, 58 y.o.   MRN: [id number]  Brandon Farrell is a pleasant 58 y.o. year old male who presents to clinic today with Hospitalization Follow-up (2 months ago) and Flu Vaccine (Will receive today.)  on 05/18/2019  HPI:  Last saw pt via virtual visit on 03/08/19 after being admitted to the hospital.  He is here today with an interpreter.  He states he feels much better, still finds himself fatigue both otherwise, all of his symptoms have resolved.  Notes reviewed- Admitted 7/27-02/26/2019 after he presented to ED with 10 days malaise, and COVID+ contacts. Finally developed dyspnea on exertion and fever which prompted him to go to the ED.  Tested positive for covid.  In the ED, he was hypoxic requiring 2L O2. CXR with LLL infiltrate.  Dg Chest Portable 1 View  Result Date: 02/21/2019 CLINICAL DATA:  Worsening fever, weakness, covid symptoms x 2 weeks, states 2 family members tested positive EXAM: PORTABLE CHEST 1 VIEW COMPARISON:  None. FINDINGS: Heart size is accentuated by technique and normal. There is faint patchy opacity at the LEFT lung base and the RIGHT lung apex no pulmonary edema. IMPRESSION: Multifocal infiltrates. Electronically Signed   By: [name] Nations M.D.   On: 02/21/2019 12:10   Diagnosed with Coronavirus pneumonitis with acute hypoxic respiratory failure In setting of ongoing 2020 COVID-19 pandemic.  Treated with steroids, 5 days remdesivir, Actemra and prophylactic dose Lovenox. Able to wean completely off O2. Symptoms resolved.   AKI- Cr 1.5 on admission, returned to baseline at discharge.  Lab Results  Component Value Date   CREATININE 1.02 03/15/2019    transaminitis- Hepatitis serologies negative.Mild transaminitis without synthetic liver dysfunction is common in COVID, likely self-limited.  Lab Results  Component Value Date   ALT 46 03/15/2019   AST 32 03/15/2019   ALKPHOS 48  03/15/2019   BILITOT 0.8 03/15/2019    He is feeling much better.  Has not had a fever since  He was admitted to the hospital.  No hurting anywhere.  He is fatigued.  No SOB.    Review of Systems  Constitutional: Positive for fatigue.  HENT: Negative.   Eyes: Negative.   Respiratory: Negative.   Cardiovascular: Negative.   Gastrointestinal: Negative.   Endocrine: Negative.   Genitourinary: Negative.   Musculoskeletal: Negative.   Skin: Negative.   Allergic/Immunologic: Negative.   Neurological: Negative.   Hematological: Negative.   Psychiatric/Behavioral: Negative.        Objective:    BP 120/80   Pulse 72   Ht 5\' 2"  (1.575 m)   Wt 201 lb 3.2 oz (91.3 kg)   SpO2 96%   BMI 36.80 kg/m    Physical Exam  General:  pleasant male in no acute distress Eyes:  PERRL Ears:  External ear exam shows no significant lesions or deformities.  TMs normal bilaterally Hearing is grossly normal bilaterally. Nose:  External nasal examination shows no deformity or inflammation. Nasal mucosa are pink and moist without lesions or exudates. Mouth:  Oral mucosa and oropharynx without lesions or exudates.  Teeth in good repair. Neck:  no carotid bruit or thyromegaly no cervical or supraclavicular lymphadenopathy  Lungs:  Normal respiratory effort, chest expands symmetrically. Lungs are clear to auscultation, no crackles or wheezes. Heart:  Normal rate and regular rhythm. S1 and S2 normal without gallop, murmur, click, rub or other extra sounds. Abdomen:  Bowel sounds positive,abdomen soft and non-tender  without masses, organomegaly or hernias noted. Pulses:  R and L posterior tibial pulses are full and equal bilaterally  Extremities:  no edema  Psych:  Good eye contact, not anxious or depressed appearing      Assessment & Plan:   Pneumonia due to COVID-19 virus - Plan: DG Chest [facility] discharge follow-up  AKI (acute kidney injury) ([place]) - Plan: Comprehensive metabolic  panel  Transaminitis - Plan: Comprehensive metabolic panel  Need for immunization against influenza - Plan: Flu Vaccine QUAD 36+ mos IM No follow-ups on file.

## 2019-05-18 ENCOUNTER — Ambulatory Visit (INDEPENDENT_AMBULATORY_CARE_PROVIDER_SITE_OTHER): Payer: BLUE CROSS/BLUE SHIELD | Admitting: Family Medicine

## 2019-05-18 ENCOUNTER — Ambulatory Visit (INDEPENDENT_AMBULATORY_CARE_PROVIDER_SITE_OTHER): Payer: BLUE CROSS/BLUE SHIELD

## 2019-05-18 ENCOUNTER — Encounter: Payer: Self-pay | Admitting: Family Medicine

## 2019-05-18 ENCOUNTER — Other Ambulatory Visit: Payer: Self-pay

## 2019-05-18 VITALS — BP 120/80 | HR 72 | Ht 62.0 in | Wt 201.2 lb

## 2019-05-18 DIAGNOSIS — N179 Acute kidney failure, unspecified: Secondary | ICD-10-CM

## 2019-05-18 DIAGNOSIS — J1289 Other viral pneumonia: Secondary | ICD-10-CM | POA: Diagnosis not present

## 2019-05-18 DIAGNOSIS — Z09 Encounter for follow-up examination after completed treatment for conditions other than malignant neoplasm: Secondary | ICD-10-CM | POA: Diagnosis not present

## 2019-05-18 DIAGNOSIS — Z8619 Personal history of other infectious and parasitic diseases: Secondary | ICD-10-CM | POA: Diagnosis not present

## 2019-05-18 DIAGNOSIS — J1282 Pneumonia due to coronavirus disease 2019: Secondary | ICD-10-CM

## 2019-05-18 DIAGNOSIS — U071 COVID-19: Secondary | ICD-10-CM | POA: Diagnosis not present

## 2019-05-18 DIAGNOSIS — R7401 Elevation of levels of liver transaminase levels: Secondary | ICD-10-CM

## 2019-05-18 DIAGNOSIS — Z23 Encounter for immunization: Secondary | ICD-10-CM

## 2019-05-18 DIAGNOSIS — Z8701 Personal history of pneumonia (recurrent): Secondary | ICD-10-CM | POA: Diagnosis not present

## 2019-05-18 LAB — CBC WITH DIFFERENTIAL/PLATELET
Basophils Absolute: 0 10*3/uL (ref 0.0–0.1)
Basophils Relative: 0.4 % (ref 0.0–3.0)
Eosinophils Absolute: 0.2 10*3/uL (ref 0.0–0.7)
Eosinophils Relative: 3 % (ref 0.0–5.0)
HCT: 44.7 % (ref 39.0–52.0)
Hemoglobin: 14.9 g/dL (ref 13.0–17.0)
Lymphocytes Relative: 34.4 % (ref 12.0–46.0)
Lymphs Abs: 2.3 10*3/uL (ref 0.7–4.0)
MCHC: 33.4 g/dL (ref 30.0–36.0)
MCV: 90.7 fl (ref 78.0–100.0)
Monocytes Absolute: 0.6 10*3/uL (ref 0.1–1.0)
Monocytes Relative: 8.8 % (ref 3.0–12.0)
Neutro Abs: 3.5 10*3/uL (ref 1.4–7.7)
Neutrophils Relative %: 53.4 % (ref 43.0–77.0)
Platelets: 214 10*3/uL (ref 150.0–400.0)
RBC: 4.92 Mil/uL (ref 4.22–5.81)
RDW: 13.2 % (ref 11.5–15.5)
WBC: 6.6 10*3/uL (ref 4.0–10.5)

## 2019-05-18 LAB — COMPREHENSIVE METABOLIC PANEL
ALT: 26 U/L (ref 0–53)
AST: 21 U/L (ref 0–37)
Albumin: 4.5 g/dL (ref 3.5–5.2)
Alkaline Phosphatase: 47 U/L (ref 39–117)
BUN: 14 mg/dL (ref 6–23)
CO2: 26 mEq/L (ref 19–32)
Calcium: 9.6 mg/dL (ref 8.4–10.5)
Chloride: 106 mEq/L (ref 96–112)
Creatinine, Ser: 1.12 mg/dL (ref 0.40–1.50)
GFR: 66.94 mL/min (ref >60.00)
Glucose, Bld: 108 mg/dL — ABNORMAL HIGH (ref 70–99)
Potassium: 4 mEq/L (ref 3.5–5.1)
Sodium: 138 mEq/L (ref 135–145)
Total Bilirubin: 0.7 mg/dL (ref 0.2–1.2)
Total Protein: 6.7 g/dL (ref 6.0–8.3)

## 2019-05-18 NOTE — Assessment & Plan Note (Signed)
Repeat liver function today. The patient indicates understanding of these issues and agrees with the plan.

## 2019-05-18 NOTE — Addendum Note (Signed)
Addended by: Lynnea Ferrier on: 05/18/2019 12:32 PM   Modules accepted: Orders

## 2019-05-18 NOTE — Assessment & Plan Note (Signed)
Lung exam reassuring and he is feeling well.  Will repeat CXR today to make sure his infiltrates have cleared. The patient indicates understanding of these issues and agrees with the plan.

## 2019-05-18 NOTE — Assessment & Plan Note (Signed)
Repeat renal function today. 

## 2019-05-18 NOTE — Patient Instructions (Signed)
Great to see you. I will call you with your results from today and you can view them online.

## 2019-07-01 NOTE — Progress Notes (Signed)
Subjective:    Patient ID: Brandon Farrell, male    DOB: 05-Sep-1960, 58 y.o.   MRN: [id number]  Chief Complaint  Patient presents with  . Wrist Pain    HPI Patient is in today for wrist pain. DOI: Both wrist and rt shoulder pain.  Pt c/o Rt shoulder pain x 81month. On 9.24.20 was seen by Dr. Ethelene Hal and imaging completed. Left wrist "Degenerative changes of the carpal bones and the first carpometacarpal joint." Right wrist "Degenerative changes of the first carpometacarpal joint and between the scaphoid and trapezium. The carpal arcs are maintained." Pain is a 9 at times mostly at night for years. Was Tx with Mobic which was ineffective. Sometimes feels like there is swelling in fingers.  He is also C/O right shoulder pain x1 month. He had been lifting at work over his head and felt no popping or tingling down arm. Very little improvement.  Past Medical History:  Diagnosis Date  . Arthritis    left knee  . Cataract   . Cholelithiasis   . Hypercholesterolemia   . Hypertension    no per pt  . Palpitations    a. 03/2012 Ex MV: No isch/infarct;  b. 04/2012 Echo: EF 55-65%, mildly dil LA.  Marland Kitchen Vertigo     Past Surgical History:  Procedure Laterality Date  . [place]   left  . LAPAROSCOPIC CHOLECYSTECTOMY  april 2013    Family History  Problem Relation Age of Onset  . Heart disease Son   . Colon cancer Neg Hx   . Stomach cancer Neg Hx   . Esophageal cancer Neg Hx   . Rectal cancer Neg Hx     Social History   Socioeconomic History  . Marital status: Married    Spouse name: Not on file  . Number of children: 2  . Years of education: Not on file  . Highest education level: Not on file  Occupational History  . Occupation: [employer]    Employer: Self Employed  Social Needs  . Financial resource strain: Not on file  . Food insecurity    Worry: Not on file    Inability: Not on file  . Transportation needs    Medical: Not on file    Non-medical: Not on file   Tobacco Use  . Smoking status: Never Smoker  . Smokeless tobacco: Never Used  Substance and Sexual Activity  . Alcohol use: Yes    Alcohol/week: 3.0 standard drinks    Types: 3 Cans of beer per week    Comment: occasional  . Drug use: No  . Sexual activity: Not on file  Lifestyle  . Physical activity    Days per week: Not on file    Minutes per session: Not on file  . Stress: Not on file  Relationships  . Social Herbalist on phone: Not on file    Gets together: Not on file    Attends religious service: Not on file    Active member of club or organization: Not on file    Attends meetings of clubs or organizations: Not on file    Relationship status: Not on file  . Intimate partner violence    Fear of current or ex partner: Not on file    Emotionally abused: Not on file    Physically abused: Not on file    Forced sexual activity: Not on file  Other Topics Concern  . Not on file  Social History Narrative  . Not on file    Outpatient Medications Prior to Visit  Medication Sig Dispense Refill  . acetaminophen (TYLENOL) 500 MG tablet Take 500 mg by mouth every 6 (six) hours as needed for fever or headache.    Marland Kitchen atorvastatin (LIPITOR) 20 MG tablet TAKE 1 TABLET (20 MG TOTAL) BY MOUTH DAILY AT 6 PM. (Patient taking differently: Take 20 mg by mouth daily. ) 90 tablet 2  . cholecalciferol (VITAMIN D3) 25 MCG (1000 UT) tablet Take 1,000 Units by mouth daily.    . Choline Fenofibrate (FENOFIBRIC ACID) 135 MG CPDR TAKE 1 CAPSULE BY MOUTH EVERY DAY 90 capsule 1  . ibuprofen (ADVIL) 200 MG tablet Take 400 mg by mouth every 6 (six) hours as needed for fever.    . meclizine (ANTIVERT) 25 MG tablet Take 1 tablet (25 mg total) by mouth 3 (three) times daily as needed for dizziness. 30 tablet 1  . ondansetron (ZOFRAN-ODT) 4 MG disintegrating tablet Take 1 tablet (4 mg total) by mouth every 8 (eight) hours as needed for nausea or vomiting. 30 tablet 0  . meloxicam (MOBIC) 15 MG  tablet TAKE 1 TABLET BY MOUTH EVERY DAY (Patient not taking: Reported on 07/04/2019) 30 tablet 0   No facility-administered medications prior to visit.     No Known Allergies  Review of Systems  Constitutional: Negative for chills and fever.  HENT: Negative for hearing loss and sinus pain.   Eyes: Negative for discharge and redness.  Respiratory: Negative for cough and shortness of breath.   Cardiovascular: Negative for chest pain.  Gastrointestinal: Negative for abdominal pain and heartburn.  Genitourinary: Negative for dysuria.  Musculoskeletal: Positive for joint pain.  Skin: Negative for rash.  Neurological: Negative for dizziness and loss of consciousness.  Endo/Heme/Allergies: Does not bruise/bleed easily.  Psychiatric/Behavioral: Negative for depression and memory loss.  All other systems reviewed and are negative.      Objective:    Physical Exam Vitals signs and nursing note reviewed.  Constitutional:      Appearance: Normal appearance. He is not ill-appearing.  HENT:     Head: Normocephalic and atraumatic.     Right Ear: External ear normal.     Left Ear: External ear normal.     Nose: Nose normal.  Eyes:     General:        Right eye: No discharge.        Left eye: No discharge.  Cardiovascular:     Rate and Rhythm: Normal rate and regular rhythm.     Heart sounds: Normal heart sounds.  Pulmonary:     Effort: Pulmonary effort is normal.     Breath sounds: No wheezing.  Abdominal:     Palpations: Abdomen is soft. There is no mass.     Tenderness: There is no abdominal tenderness. There is no guarding.  Musculoskeletal: Normal range of motion.     Right wrist: He exhibits swelling.     Left wrist: He exhibits swelling.     Right lower leg: No edema.     Left lower leg: No edema.     Comments: Negative arc sign. Positive empty can test on the right.  Overall has good range of motion of right shoulder.  Skin:    General: Skin is dry.  Neurological:      Mental Status: He is alert and oriented to person, place, and time.     Deep Tendon Reflexes: Reflexes normal.  Comments: Bilateral symmetrical grip strength.  Psychiatric:        Mood and Affect: Mood normal.        Behavior: Behavior normal.     BP 100/90 (BP Location: Right Arm, Patient Position: Sitting, Cuff Size: Normal)   Pulse 66   Temp (!) 96.8 F (36 C) (Temporal)   Ht 5\' 2"  (1.575 m)   Wt 199 lb 12.8 oz (90.6 kg)   SpO2 97%   BMI 36.54 kg/m  Wt Readings from Last 3 Encounters:  07/04/19 199 lb 12.8 oz (90.6 kg)  05/18/19 201 lb 3.2 oz (91.3 kg)  04/21/19 201 lb (91.2 kg)     Lab Results  Component Value Date   WBC 6.6 05/18/2019   HGB 14.9 05/18/2019   HCT 44.7 05/18/2019   PLT 214.0 05/18/2019   GLUCOSE 108 (H) 05/18/2019   CHOL 104 02/23/2019   TRIG 126 02/23/2019   HDL 12 (L) 02/23/2019   LDLDIRECT 93.0 02/24/2017   LDLCALC 67 02/23/2019   ALT 26 05/18/2019   AST 21 05/18/2019   NA 138 05/18/2019   K 4.0 05/18/2019   CL 106 05/18/2019   CREATININE 1.12 05/18/2019   BUN 14 05/18/2019   CO2 26 05/18/2019   TSH 3.71 02/24/2017   PSA 0.84 03/16/2012   HGBA1C 5.9 03/27/2011    Lab Results  Component Value Date   TSH 3.71 02/24/2017   Lab Results  Component Value Date   WBC 6.6 05/18/2019   HGB 14.9 05/18/2019   HCT 44.7 05/18/2019   MCV 90.7 05/18/2019   PLT 214.0 05/18/2019   Lab Results  Component Value Date   NA 138 05/18/2019   K 4.0 05/18/2019   CO2 26 05/18/2019   GLUCOSE 108 (H) 05/18/2019   BUN 14 05/18/2019   CREATININE 1.12 05/18/2019   BILITOT 0.7 05/18/2019   ALKPHOS 47 05/18/2019   AST 21 05/18/2019   ALT 26 05/18/2019   PROT 6.7 05/18/2019   ALBUMIN 4.5 05/18/2019   CALCIUM 9.6 05/18/2019   ANIONGAP 11 02/26/2019   GFR 66.94 05/18/2019   Lab Results  Component Value Date   CHOL 104 02/23/2019   Lab Results  Component Value Date   HDL 12 (L) 02/23/2019   Lab Results  Component Value Date   LDLCALC 67  02/23/2019   Lab Results  Component Value Date   TRIG 126 02/23/2019   Lab Results  Component Value Date   CHOLHDL 8.7 02/23/2019   Lab Results  Component Value Date   HGBA1C 5.9 03/27/2011       Assessment & Plan:   Problem List Items Addressed This Visit      Active Problems   Degenerative arthritis of carpometacarpal joint of thumb - Primary    On 9.24.20 was seen by Dr. Ethelene Hal and imaging completed. Left wrist "Degenerative changes of the carpal bones and the first carpometacarpal joint." Right wrist "Degenerative changes of the first carpometacarpal joint and between the scaphoid and trapezium. The carpal arcs are maintained." Pain is a 9 at times mostly at night for years.  Was Tx with Mobic which was ineffective.We are referring patient to a Hand Specialist for further eval and Tx I.e. injections etc.          Relevant Orders   Ambulatory referral to Hand Surgery   Right shoulder pain    Consistent with rotator cuff tendonitis on right. He unlikely has a tear given his good range of motion.  He declines PT as he is going to [place] in 2 weeks.  He will need to do exercises- handout given from sports med advisor.  We will also send in a prescription for Voltaren Gel  Orders Placed This Encounter  Procedures  . Ambulatory referral to Hand Surgery             I am having Ngoc Taflinger start on diclofenac Sodium. I am also having him maintain his Fenofibric Acid, atorvastatin, ibuprofen, cholecalciferol, acetaminophen, meclizine, ondansetron, and meloxicam.  Meds ordered this encounter  Medications  . diclofenac Sodium (VOLTAREN) 1 % GEL    Sig: Apply topically to right shoulder and wrists bilaterally qid prn    Dispense:  150 g    Refill:  11     Arnette Norris, MD  This visit occurred during the SARS-CoV-2 public health emergency.  Safety protocols were in place, including screening questions prior to the visit, additional usage of staff PPE, and extensive  cleaning of exam room while observing appropriate contact time as indicated for disinfecting solutions.

## 2019-07-04 ENCOUNTER — Ambulatory Visit (INDEPENDENT_AMBULATORY_CARE_PROVIDER_SITE_OTHER): Payer: BLUE CROSS/BLUE SHIELD | Admitting: Family Medicine

## 2019-07-04 ENCOUNTER — Encounter: Payer: Self-pay | Admitting: Family Medicine

## 2019-07-04 ENCOUNTER — Other Ambulatory Visit: Payer: Self-pay

## 2019-07-04 VITALS — BP 100/90 | HR 66 | Temp 96.8°F | Ht 62.0 in | Wt 199.8 lb

## 2019-07-04 DIAGNOSIS — M25511 Pain in right shoulder: Secondary | ICD-10-CM

## 2019-07-04 DIAGNOSIS — M180 Bilateral primary osteoarthritis of first carpometacarpal joints: Secondary | ICD-10-CM | POA: Diagnosis not present

## 2019-07-04 MED ORDER — DICLOFENAC SODIUM 1 % EX GEL: CUTANEOUS | 11 refills | Status: AC

## 2019-07-04 NOTE — Assessment & Plan Note (Addendum)
On 9.24.20 was seen by Dr. Ethelene Hal and imaging completed. Left wrist "Degenerative changes of the carpal bones and the first carpometacarpal joint." Right wrist "Degenerative changes of the first carpometacarpal joint and between the scaphoid and trapezium. The carpal arcs are maintained." Pain is a 9 at times mostly at night for years.  Was Tx with Mobic which was ineffective.We are referring patient to a Hand Specialist for further eval and Tx I.e. injections etc.

## 2019-07-04 NOTE — Assessment & Plan Note (Signed)
Consistent with rotator cuff tendonitis on right. He unlikely has a tear given his good range of motion.  He declines PT as he is going to [place] in 2 weeks.  He will need to do exercises- handout given from sports med advisor.  We will also send in a prescription for Voltaren Gel  Orders Placed This Encounter  Procedures  . Ambulatory referral to Hand Surgery

## 2019-07-04 NOTE — Patient Instructions (Addendum)
We are referring you to a hand specialist. Use Voltaren gel as directed.  Try exercises in handout.  Have a great and safe trip.

## 2019-07-05 ENCOUNTER — Other Ambulatory Visit: Payer: Self-pay

## 2019-07-05 MED ORDER — FENOFIBRIC ACID 135 MG PO CPDR: 1.0000 | DELAYED_RELEASE_CAPSULE | Freq: Every day | ORAL | 1 refills | Status: AC

## 2019-07-05 NOTE — Telephone Encounter (Signed)
Last OV 07/04/19 Last fill 10/06/18  #90/1

## 2019-08-29 ENCOUNTER — Other Ambulatory Visit: Payer: Self-pay | Admitting: Cardiovascular Disease

## 2019-09-21 ENCOUNTER — Other Ambulatory Visit: Payer: Self-pay | Admitting: Cardiovascular Disease

## 2019-10-04 ENCOUNTER — Other Ambulatory Visit: Payer: Self-pay | Admitting: Cardiovascular Disease

## 2019-10-23 ENCOUNTER — Other Ambulatory Visit: Payer: Self-pay | Admitting: Cardiovascular Disease

## 2019-12-06 ENCOUNTER — Other Ambulatory Visit: Payer: Self-pay | Admitting: Cardiovascular Disease

## 2020-08-17 IMAGING — DX PORTABLE CHEST - 1 VIEW
1 series · 1 of 1 positions shown · non-contrast
Comparison: None.

CLINICAL DATA: Worsening fever, weakness, covid symptoms x 2 weeks,
states 2 family members tested positive

EXAM:
PORTABLE CHEST 1 VIEW

[chest ap]
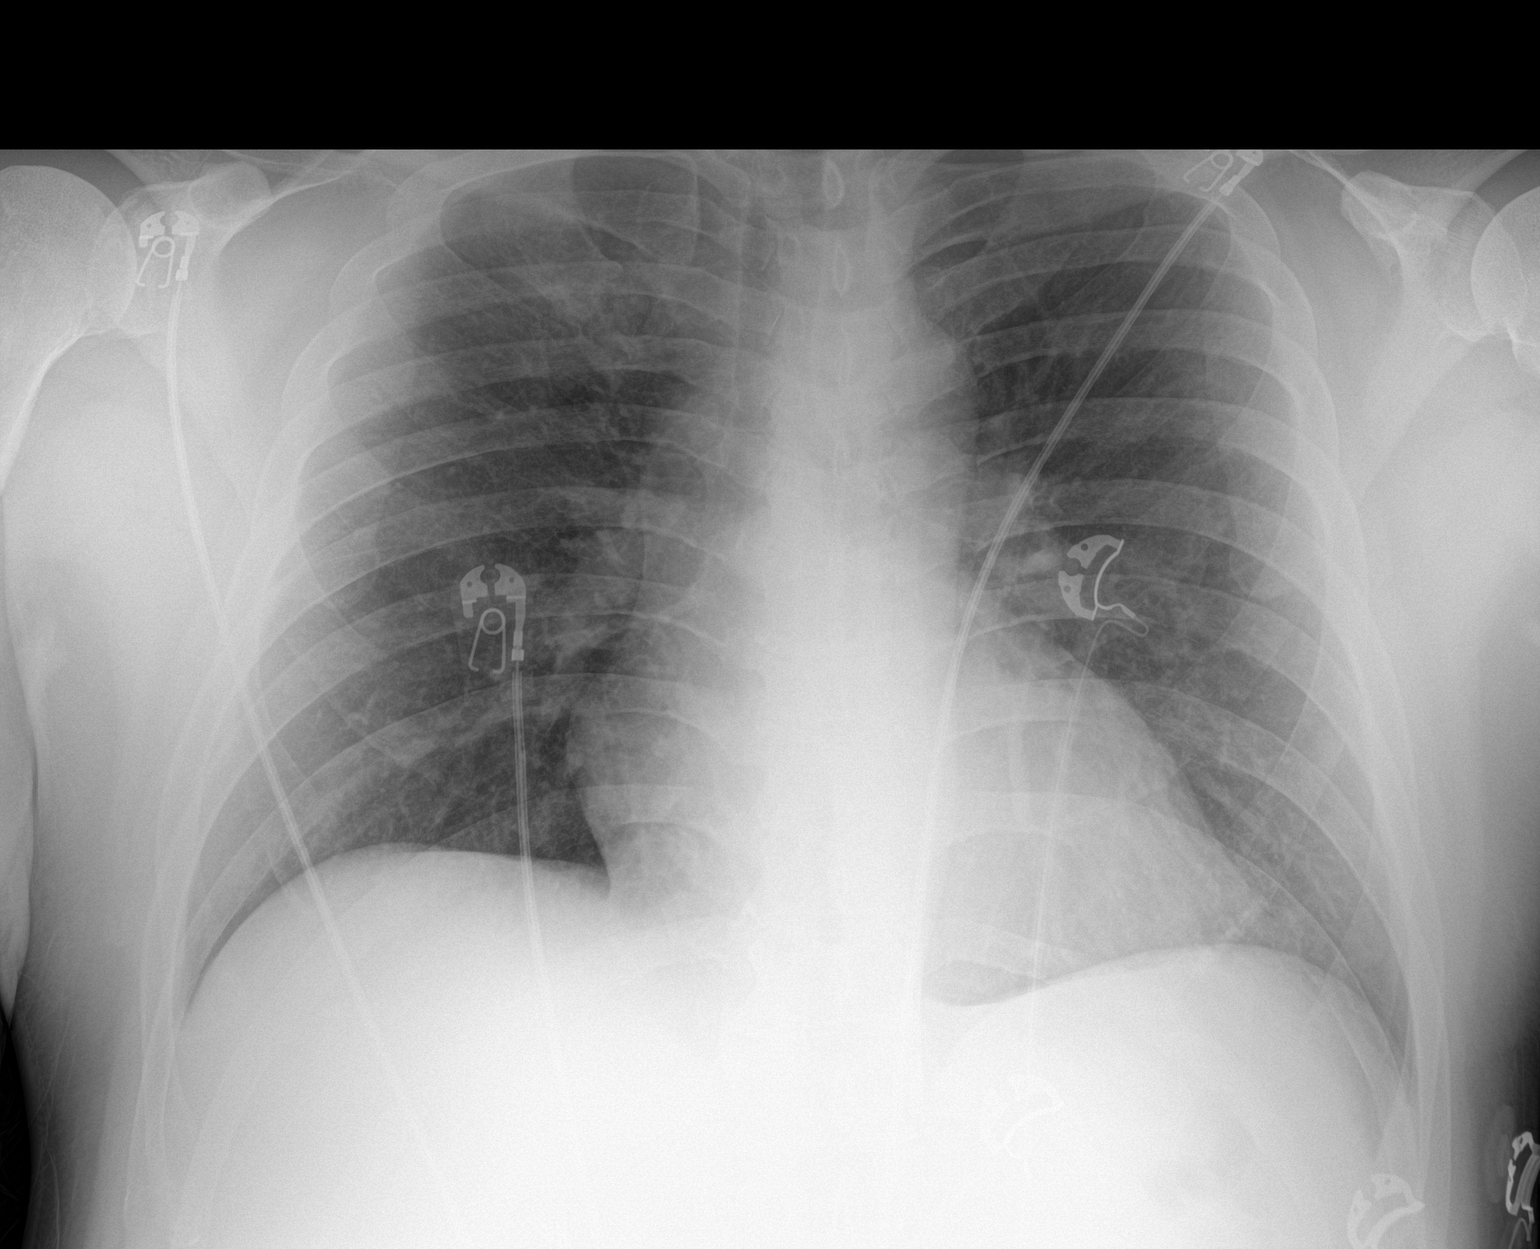

[1 of 1 positions shown; findings below may reference images not displayed]

FINDINGS: Heart size is accentuated by technique and normal. There is faint
patchy opacity at the LEFT lung base and the RIGHT lung apex no
pulmonary edema.
IMPRESSION: Multifocal infiltrates.

## 2020-10-15 IMAGING — DX DG WRIST COMPLETE 3+V*L*
4 series · 4 of 4 positions shown · non-contrast
Comparison: None.

CLINICAL DATA: 58-year-old male with a history of stiffness

EXAM:
LEFT WRIST - COMPLETE 3+ VIEW

[wrist pa]
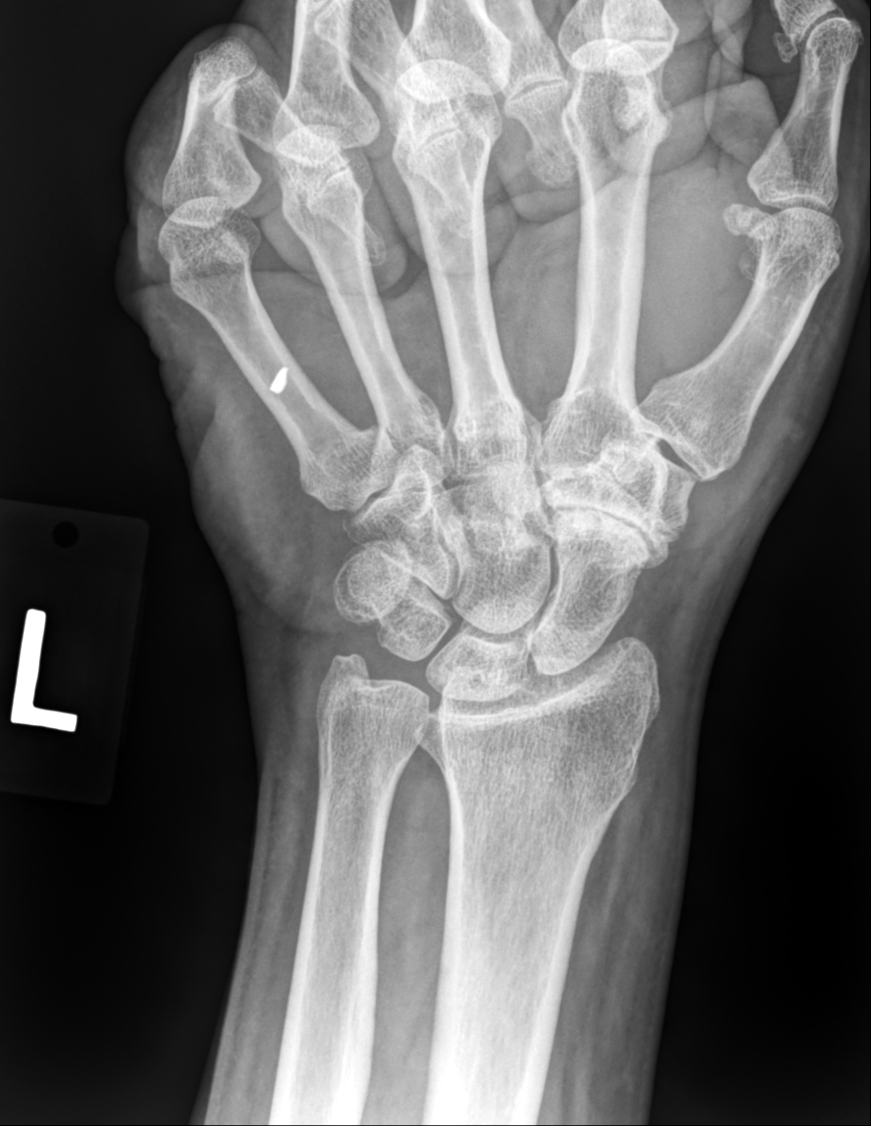

[wrist mlo]
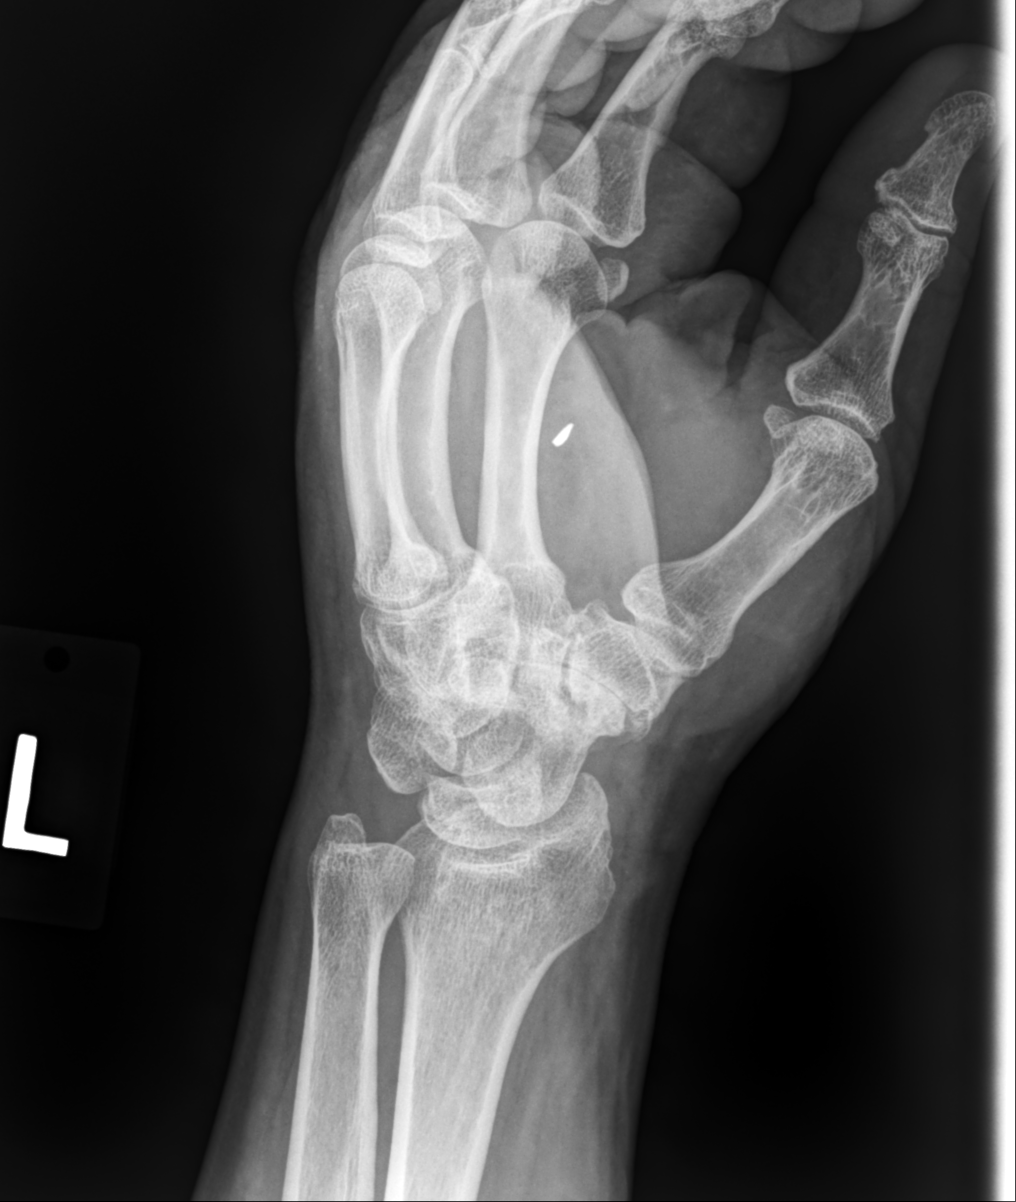

[wrist lat]
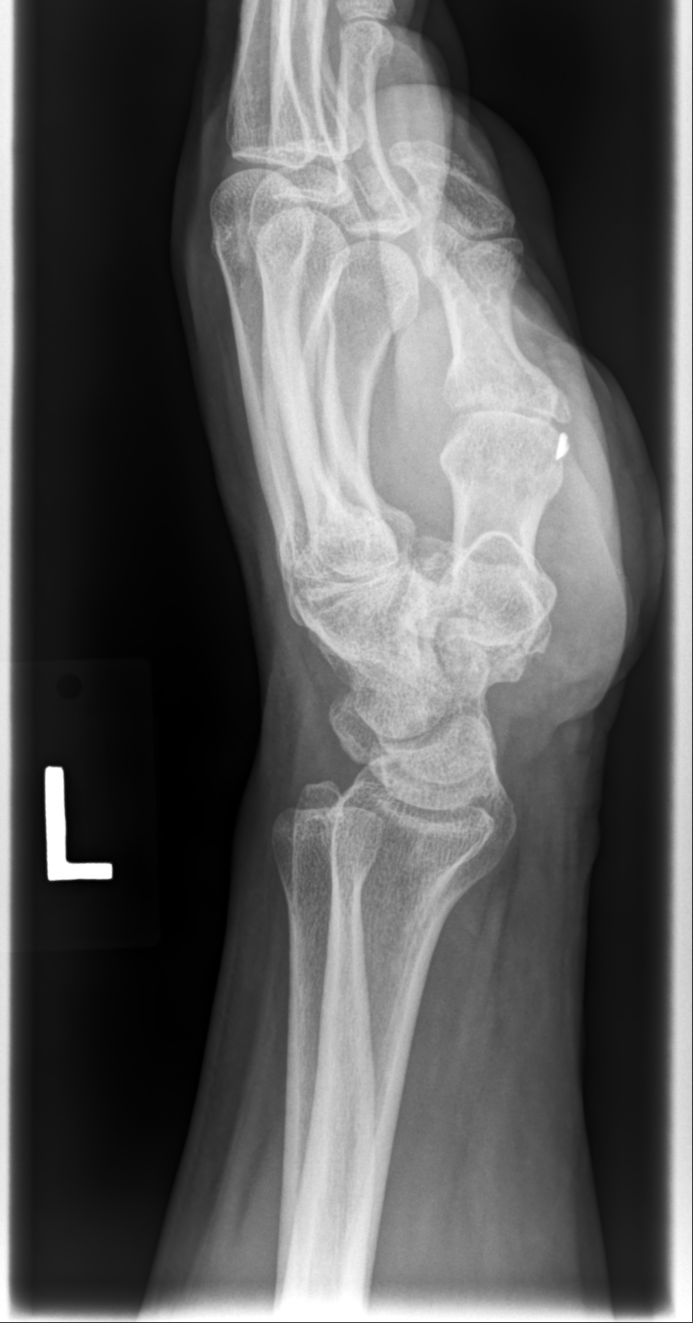

[wrist (navicular) pa]
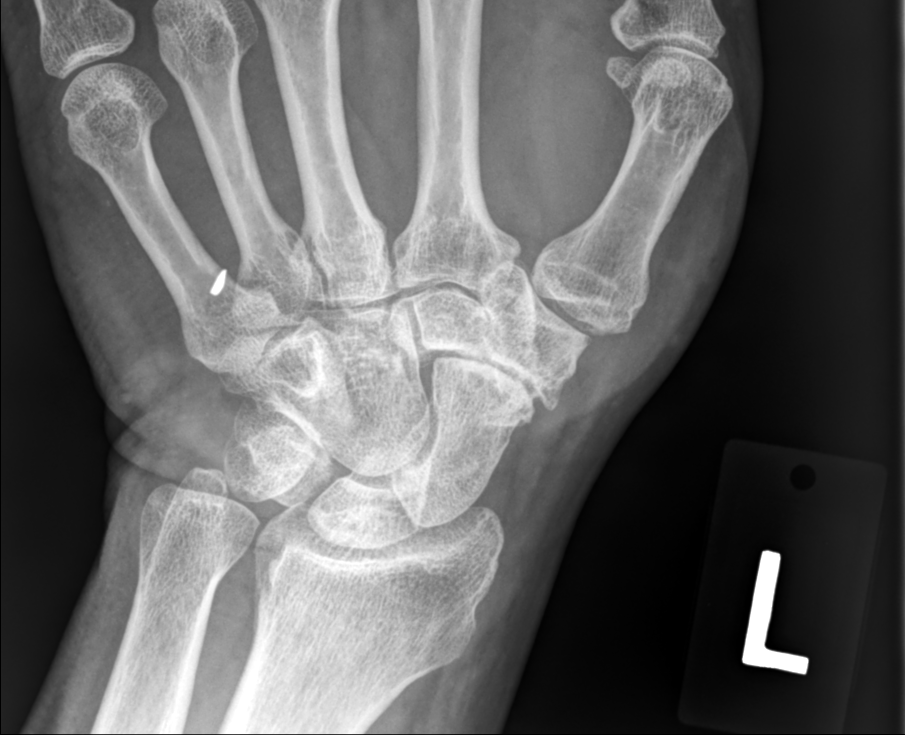

[4 of 4 positions shown; findings below may reference images not displayed]

FINDINGS: No acute displaced fracture. Carpal arcs are maintained.
Degenerative changes of the first carpometacarpal joint.
Degenerative changes of the interface of scaphoid and trapezium.

Small radiopaque foreign body within the palmar soft tissues
overlying the fifth metacarpal.

No focal soft tissue swelling.  No erosive changes.
IMPRESSION: Negative for acute bony abnormality.

Degenerative changes of the carpal bones and the first
carpometacarpal joint.

Small radiopaque foreign body within the palmar soft tissues
adjacent to the fifth metacarpal

## 2021-02-17 ENCOUNTER — Encounter: Payer: Self-pay | Admitting: Cardiovascular Disease

## 2021-02-17 NOTE — Progress Notes (Signed)
Cardiology Office Note   Date:  02/17/2021   ID:  Brandon Farrell, Brandon Farrell 1960-12-08, MRN [id number]  PCP:  No primary care provider on file.  Cardiologist:   Mertie Moores, MD   Chief Complaint  Patient presents with   Hyperlipidemia   1. Hypertriglyceridemia     Brandon Farrell is a 60 yo hispanic gentleman with a hx of hyperlipidemia.  He was accompanied by a friend who serves as Astronomer. His son died 50 in his sleep at age 51.  He's here today for evaluation and risk reduction. He's not had any cardiac complaints.   Brandon Farrell remains quite active. He plays soccer on regular basis. He's never had episodes of chest pain or shortness breath.  He owns [employer].  He has a history of hyperlipidemia and his lipids have been fairly well controlled.  Dec. 4, 2013: Following his initial consultation in August we performed an echocardiogram and Myoview study. The echo revealed normal left ventricular systolic function. The Myoview  revealed no evidence of ischemia and also revealed normal left ventricular systolic function.  He hs dizziness for the past 2 weeks when he goes from a lying position to a sitting position or standing position.   Oct. 1, 2014:  Brandon Farrell presents with some episodes of dizziness.  The symptoms were were worse when he would lie back.   He has not been playing soccer because of a back issue.   He has had a normal echo and a normal myoview study.    He has not had any chest pain or dyspnea.   March 22, 2014:  Brandon Farrell is seen today for followup visit. He was seen by Ignacia Bayley, PA for some palpitations. He placed to monitor on him.   So far, there have not been any significant arrhythmias   October 02, 2014:   Brandon Farrell is a 60 y.o. male who presents for follow up of his syncope. Seen with Frankey Poot. No further episode of syncope  Not as active this past winter but plays soccor on a regular basis.   04/25/2015:  Pt is seen with Adin Hector, Intrepreter.  Doing ok. Limited by orthopedic injuries , not playing soccer . No CP or dyspnea Was started on Lipitor in March .  Labs in June looked great.   November 13, 2015:  Pt is seen with Georgiann Mohs, intrepreter . Doing well  Walking , not much soccer.    November 19, 2016:  Brandon Farrell is seen today .  No CP or dyspnea.  Walking some.   Has a knee injury that keeps him from playing soccer.   February 18, 2021: Brandon Farrell is seen today after 4 year absence Was seen by Kathyrn Drown, NP in 2019 Seen with Consuello Bossier ( interpreter)  No CP or dyspnea Walks on occasion  25 minutes 3 days a week  Is on atorvastatin 20 mg a day  Has been managed by his primary care Dr. Salina April in [place]   Past Medical History:  Diagnosis Date   Arthritis    left knee   Cataract    Cholelithiasis    Hypercholesterolemia    Hypertension    no per pt   Palpitations    a. 03/2012 Ex MV: No isch/infarct;  b. 04/2012 Echo: EF 55-65%, mildly dil LA.   Vertigo     Past Surgical History:  Procedure Laterality Date   KNEE SURGERY  1999   left   LAPAROSCOPIC CHOLECYSTECTOMY  april 2013     Current Outpatient Medications  Medication Sig Dispense Refill   acetaminophen (TYLENOL) 500 MG tablet Take 500 mg by mouth every 6 (six) hours as needed for fever or headache.     atorvastatin (LIPITOR) 20 MG tablet Take 1 tablet (20 mg total) by mouth daily. PLEASE CALL AND SCHEDULE AN APPT FOR FURTHER REFILLS. [phone] 2nd attempt 15 tablet 0   cholecalciferol (VITAMIN D3) 25 MCG (1000 UT) tablet Take 1,000 Units by mouth daily.     Choline Fenofibrate (FENOFIBRIC ACID) 135 MG CPDR Take 1 tablet by mouth daily. 90 capsule 1   diclofenac Sodium (VOLTAREN) 1 % GEL Apply topically to right shoulder and wrists bilaterally qid prn 150 g 11   ibuprofen (ADVIL) 200 MG tablet Take 400 mg by mouth every 6 (six) hours as needed for fever.     meclizine (ANTIVERT) 25 MG tablet Take 1 tablet (25 mg total) by  mouth 3 (three) times daily as needed for dizziness. 30 tablet 1   meloxicam (MOBIC) 15 MG tablet TAKE 1 TABLET BY MOUTH EVERY DAY (Patient not taking: Reported on 07/04/2019) 30 tablet 0   ondansetron (ZOFRAN-ODT) 4 MG disintegrating tablet Take 1 tablet (4 mg total) by mouth every 8 (eight) hours as needed for nausea or vomiting. 30 tablet 0   No current facility-administered medications for this visit.    Allergies:   Patient has no known allergies.    Social History:  The patient  reports that he has never smoked. He has never used smokeless tobacco. He reports current alcohol use of about 3.0 standard drinks of alcohol per week. He reports that he does not use drugs.   Family History:  The patient's family history includes Heart disease in his son.    ROS:  Please see the history of present illness.  Otherwise systems are negative  Physical Exam: There were no vitals taken for this visit.  GEN:  Well nourished, well developed in no acute distress HEENT: Normal NECK: No JVD; No carotid bruits LYMPHATICS: No lymphadenopathy CARDIAC: RRR , no murmurs, rubs, gallops RESPIRATORY:  Clear to auscultation without rales, wheezing or rhonchi  ABDOMEN: Soft, non-tender, non-distended MUSCULOSKELETAL:  No edema; No deformity  SKIN: Warm and dry NEUROLOGIC:  Alert and oriented x 3   EKG: February 18, 2021: Sinus bradycardia at 56.  No ST or T wave changes.  Recent Labs: No results found for requested labs within last 8760 hours.    Lipid Panel    Component Value Date/Time   CHOL 104 02/23/2019 0230   CHOL 155 07/06/2018 0844   TRIG 126 02/23/2019 0230   HDL 12 (L) 02/23/2019 0230   HDL 33 (L) 07/06/2018 0844   CHOLHDL 8.7 02/23/2019 0230   VLDL 25 02/23/2019 0230   LDLCALC 67 02/23/2019 0230   LDLCALC 104 (H) 07/06/2018 0844   LDLDIRECT 93.0 02/24/2017 1315      Wt Readings from Last 3 Encounters:  07/04/19 199 lb 12.8 oz (90.6 kg)  05/18/19 201 lb 3.2 oz (91.3 kg)   04/21/19 201 lb (91.2 kg)      Other studies Reviewed: Additional studies/ records that were reviewed today include: lipid levels from Dr. Hulen Shouts visit. Review of the above records demonstrates: persistently elevated chol levels.   ASSESSMENT AND PLAN:  1.  Hyperlipidemia: He seems to be doing well.  This is now being followed by his primary medical doctor.  He had some recent labs and his levels  are acceptable.  He has not had any episodes of chest pain.  We will have him see [place] on an as-needed basis.   Current medicines are reviewed at length with the patient today.  The patient does not have concerns regarding medicines.  The following changes have been made:  See above   Disposition:   PRN .    Signed, Mertie Moores, MD  02/17/2021 8:35 PM    [address], [place], [place]  [address] Phone: [phone]; Fax: [phone]

## 2021-02-18 ENCOUNTER — Other Ambulatory Visit: Payer: Self-pay

## 2021-02-18 ENCOUNTER — Ambulatory Visit: Payer: BLUE CROSS/BLUE SHIELD | Admitting: Cardiovascular Disease

## 2021-02-18 VITALS — BP 122/78 | HR 56 | Ht 66.0 in | Wt 201.0 lb

## 2021-02-18 DIAGNOSIS — E782 Mixed hyperlipidemia: Secondary | ICD-10-CM

## 2021-02-18 NOTE — Patient Instructions (Signed)
Medication Instructions:  Your physician recommends that you continue on your current medications as directed. Please refer to the Current Medication list given to you today.  *If you need a refill on your cardiac medications before your next appointment, please call your pharmacy*   Follow-Up: At [facility], you and your health needs are our priority.  As part of our continuing mission to provide you with exceptional heart care, we have created designated Provider Care Teams.  These Care Teams include your primary Cardiologist (physician) and Advanced Practice Providers (APPs -  Physician Assistants and Nurse Practitioners) who all work together to provide you with the care you need, when you need it.  Follow up with Dr. Acie Fredrickson as needed.

## 2021-04-03 ENCOUNTER — Encounter: Payer: Self-pay | Admitting: Gastroenterology
